# Patient Record
Sex: Female | Born: 1970
Health system: Southern US, Community
[De-identification: ages and names within clinical notes are randomized; demographics above are authoritative.]

## PROBLEM LIST (undated history)

## (undated) DIAGNOSIS — R011 Cardiac murmur, unspecified: Secondary | ICD-10-CM

## (undated) DIAGNOSIS — I10 Essential (primary) hypertension: Secondary | ICD-10-CM

## (undated) DIAGNOSIS — D649 Anemia, unspecified: Secondary | ICD-10-CM

## (undated) DIAGNOSIS — Z1239 Encounter for other screening for malignant neoplasm of breast: Secondary | ICD-10-CM

## (undated) HISTORY — DX: Anemia, unspecified: D64.9

## (undated) HISTORY — PX: BREAST EXCISIONAL BIOPSY: SUR124

## (undated) HISTORY — DX: Encounter for other screening for malignant neoplasm of breast: Z12.39

## (undated) HISTORY — PX: DILATION AND CURETTAGE OF UTERUS: SHX78

## (undated) HISTORY — PX: ABDOMINAL HYSTERECTOMY: SHX81

---

## 2002-12-08 HISTORY — PX: BREAST SURGERY: SHX581

## 2003-11-17 HISTORY — PX: OTHER SURGICAL HISTORY: SHX169

## 2005-10-22 ENCOUNTER — Ambulatory Visit: Payer: Self-pay | Admitting: General Surgery

## 2006-06-16 ENCOUNTER — Ambulatory Visit: Payer: Self-pay | Admitting: Internal Medicine

## 2006-07-17 ENCOUNTER — Ambulatory Visit: Payer: Self-pay | Admitting: Internal Medicine

## 2006-08-16 ENCOUNTER — Ambulatory Visit: Payer: Self-pay | Admitting: Internal Medicine

## 2006-09-16 ENCOUNTER — Ambulatory Visit: Payer: Self-pay | Admitting: Internal Medicine

## 2006-10-21 ENCOUNTER — Ambulatory Visit: Payer: Self-pay | Admitting: Internal Medicine

## 2006-10-25 ENCOUNTER — Ambulatory Visit: Payer: Self-pay | Admitting: General Surgery

## 2006-11-16 ENCOUNTER — Ambulatory Visit: Payer: Self-pay | Admitting: Internal Medicine

## 2006-12-17 ENCOUNTER — Ambulatory Visit: Payer: Self-pay | Admitting: Internal Medicine

## 2007-03-08 ENCOUNTER — Ambulatory Visit: Payer: Self-pay | Admitting: Internal Medicine

## 2007-03-17 ENCOUNTER — Ambulatory Visit: Payer: Self-pay | Admitting: Internal Medicine

## 2007-06-08 ENCOUNTER — Ambulatory Visit: Payer: Self-pay | Admitting: Internal Medicine

## 2007-06-09 ENCOUNTER — Ambulatory Visit: Payer: Self-pay | Admitting: Internal Medicine

## 2007-06-17 ENCOUNTER — Ambulatory Visit: Payer: Self-pay | Admitting: Internal Medicine

## 2007-07-18 ENCOUNTER — Ambulatory Visit: Payer: Self-pay | Admitting: Internal Medicine

## 2007-08-04 ENCOUNTER — Ambulatory Visit: Payer: Self-pay | Admitting: Internal Medicine

## 2007-08-17 ENCOUNTER — Ambulatory Visit: Payer: Self-pay | Admitting: Internal Medicine

## 2007-11-07 ENCOUNTER — Ambulatory Visit: Payer: Self-pay | Admitting: Internal Medicine

## 2007-11-17 ENCOUNTER — Ambulatory Visit: Payer: Self-pay | Admitting: Internal Medicine

## 2007-11-24 ENCOUNTER — Ambulatory Visit: Payer: Self-pay | Admitting: General Surgery

## 2008-02-21 DIAGNOSIS — J40 Bronchitis, not specified as acute or chronic: Secondary | ICD-10-CM | POA: Insufficient documentation

## 2008-11-26 ENCOUNTER — Ambulatory Visit: Payer: Self-pay | Admitting: General Surgery

## 2009-01-28 ENCOUNTER — Ambulatory Visit: Payer: Self-pay | Admitting: Internal Medicine

## 2009-12-26 ENCOUNTER — Ambulatory Visit: Payer: Self-pay | Admitting: General Surgery

## 2010-12-30 ENCOUNTER — Ambulatory Visit: Payer: Self-pay | Admitting: General Surgery

## 2011-11-17 DIAGNOSIS — Z1239 Encounter for other screening for malignant neoplasm of breast: Secondary | ICD-10-CM

## 2011-11-17 HISTORY — DX: Encounter for other screening for malignant neoplasm of breast: Z12.39

## 2012-01-13 ENCOUNTER — Ambulatory Visit: Payer: Self-pay | Admitting: Obstetrics & Gynecology

## 2012-12-24 ENCOUNTER — Encounter: Payer: Self-pay | Admitting: *Deleted

## 2014-01-10 ENCOUNTER — Ambulatory Visit: Payer: Self-pay | Admitting: General Surgery

## 2014-01-18 ENCOUNTER — Encounter: Payer: Self-pay | Admitting: General Surgery

## 2014-01-18 ENCOUNTER — Ambulatory Visit (INDEPENDENT_AMBULATORY_CARE_PROVIDER_SITE_OTHER): Payer: BC Managed Care – PPO | Admitting: General Surgery

## 2014-01-18 VITALS — BP 138/74 | HR 72 | Resp 12 | Ht 64.0 in | Wt 132.0 lb

## 2014-01-18 DIAGNOSIS — N6019 Diffuse cystic mastopathy of unspecified breast: Secondary | ICD-10-CM

## 2014-01-18 DIAGNOSIS — Z803 Family history of malignant neoplasm of breast: Secondary | ICD-10-CM

## 2014-01-18 NOTE — Patient Instructions (Signed)
Follow up in one year with bilateral screening mammogram and office visit. Continue self breast exams. Call office for any new breast issues or concerns.

## 2014-01-18 NOTE — Progress Notes (Signed)
Patient ID: Priscilla Fernandez, female   DOB: Jun 18, 1971, 43 y.o.   MRN: [id number]  Chief Complaint  Patient presents with  . Follow-up    mammogram    HPI Priscilla Fernandez is a 43 y.o. female.  who presents for follow up mammogram breast evaluation. The most recent mammogram was done on 10-09-14.  Patient does perform regular self breast checks and gets regular mammograms done.  No new breast issues. She has had multiple prior breast biopsies-all benign.  HPI  Past Medical History  Diagnosis Date  . Anemia   . Breast screening, unspecified 2013    Past Surgical History  Procedure Laterality Date  . Breast tumor removal   2005  . Dilation and curettage of uterus    . Breast surgery Right 12-08-02    fibrocystic    Family History  Problem Relation Age of Onset  . Breast cancer Maternal Grandmother     Social History History  Substance Use Topics  . Smoking status: Never Smoker   . Smokeless tobacco: Never Used  . Alcohol Use: Yes     Comment: rare    No Known Allergies  No current outpatient prescriptions on file.   No current facility-administered medications for this visit.    Review of Systems Review of Systems  Constitutional: Negative.   Respiratory: Negative.   Cardiovascular: Negative.     Blood pressure 138/74, pulse 72, resp. rate 12, height 5\' 4"  (1.626 m), weight 132 lb (59.875 kg), last menstrual period 01/02/2014.  Physical Exam Physical Exam  Constitutional: She is oriented to person, place, and time. She appears well-developed and well-nourished.  Eyes: No scleral icterus.  Neck: Neck supple.  Cardiovascular: Normal rate and regular rhythm.   Murmur heard. Stable heart murmur  Pulmonary/Chest: Effort normal and breath sounds normal. Right breast exhibits no inverted nipple, no mass, no nipple discharge, no skin change and no tenderness. Left breast exhibits no inverted nipple, no mass, no nipple discharge, no skin change and no tenderness.   Lymphadenopathy:    She has no cervical adenopathy.    She has no axillary adenopathy.  Neurological: She is alert and oriented to person, place, and time.  Skin: Skin is warm and dry.    Data Reviewed Mammogram reviewed and stable.  Assessment    Stable physical exam.    Plan    Follow up in one year with bilateral screening mammogram and office visit.       Taneal Sonntag G 01/18/2014, 2:59 PM

## 2014-01-29 ENCOUNTER — Ambulatory Visit: Payer: Self-pay | Admitting: General Surgery

## 2014-04-12 ENCOUNTER — Ambulatory Visit: Payer: Self-pay | Admitting: Emergency Medicine

## 2014-07-26 ENCOUNTER — Ambulatory Visit: Payer: Self-pay | Admitting: Physician Assistant

## 2014-09-17 ENCOUNTER — Encounter: Payer: Self-pay | Admitting: General Surgery

## 2014-10-29 ENCOUNTER — Encounter: Payer: Self-pay | Admitting: General Surgery

## 2014-10-29 ENCOUNTER — Ambulatory Visit (INDEPENDENT_AMBULATORY_CARE_PROVIDER_SITE_OTHER): Payer: BC Managed Care – PPO | Admitting: General Surgery

## 2014-10-29 VITALS — BP 120/80 | HR 76 | Resp 12 | Ht 64.0 in | Wt 133.0 lb

## 2014-10-29 DIAGNOSIS — N6019 Diffuse cystic mastopathy of unspecified breast: Secondary | ICD-10-CM

## 2014-10-29 NOTE — Progress Notes (Signed)
Patient ID: Priscilla Fernandez, female   DOB: Aug 24, 1971, 43 y.o.   MRN: [id number]  Chief Complaint  Patient presents with  . Follow-up    mammogram    HPI Priscilla Fernandez is a 43 y.o. female who presents for a breast evaluation. The most recent mammogram was done on 10/16/14. Patient does perform regular self breast checks and gets regular mammograms done. No complaints at this time.   HPI  Past Medical History  Diagnosis Date  . Anemia   . Breast screening, unspecified 2013    Past Surgical History  Procedure Laterality Date  . Breast tumor removal   2005  . Dilation and curettage of uterus    . Breast surgery Right 12-08-02    fibrocystic    Family History  Problem Relation Age of Onset  . Breast cancer Maternal Grandmother     Social History History  Substance Use Topics  . Smoking status: Never Smoker   . Smokeless tobacco: Never Used  . Alcohol Use: Yes     Comment: rare    No Known Allergies  No current outpatient prescriptions on file.   No current facility-administered medications for this visit.    Review of Systems Review of Systems  Constitutional: Negative.   Respiratory: Negative.   Cardiovascular: Negative.     Blood pressure 120/80, pulse 76, resp. rate 12, height 5\' 4"  (1.626 m), weight 133 lb (60.328 kg), last menstrual period 10/13/2014.  Physical Exam Physical Exam  Constitutional: She is oriented to person, place, and time. She appears well-developed and well-nourished.  Eyes: Conjunctivae are normal. No scleral icterus.  Neck: Neck supple. No thyromegaly present.  Cardiovascular: Normal rate and regular rhythm.   Murmur heard.  Systolic murmur is present with a grade of 3/6  Murmur unchanged from before  Pulmonary/Chest: Effort normal and breath sounds normal. Right breast exhibits no inverted nipple, no mass, no nipple discharge, no skin change and no tenderness. Left breast exhibits no inverted nipple, no mass, no nipple discharge, no  skin change and no tenderness.  Lymphadenopathy:    She has no cervical adenopathy.    She has no axillary adenopathy.  Neurological: She is alert and oriented to person, place, and time.  Skin: Skin is warm and dry.    Data Reviewed  Mammogram reviewed- cat 2. Very dense breasts as noted before.   Assessment    Stable Exam. History of FCD, multiple prior biopsies    Plan    Patient to return in 1 year with bilateral screening mammogram.        Priscilla Fernandez 10/29/2014, 4:21 PM

## 2014-10-29 NOTE — Patient Instructions (Signed)
Patient to return in 1 year with bilateral screening mammogram. Continue self breast exams. Call office for any new breast issues or concerns.  

## 2015-10-30 LAB — HM MAMMOGRAPHY

## 2015-11-04 ENCOUNTER — Ambulatory Visit: Payer: Self-pay | Admitting: General Surgery

## 2015-11-06 ENCOUNTER — Ambulatory Visit (INDEPENDENT_AMBULATORY_CARE_PROVIDER_SITE_OTHER): Payer: BLUE CROSS/BLUE SHIELD | Admitting: General Surgery

## 2015-11-06 ENCOUNTER — Encounter: Payer: Self-pay | Admitting: General Surgery

## 2015-11-06 VITALS — BP 144/72 | HR 72 | Resp 12 | Ht 64.0 in | Wt 134.0 lb

## 2015-11-06 DIAGNOSIS — D649 Anemia, unspecified: Secondary | ICD-10-CM

## 2015-11-06 DIAGNOSIS — N6019 Diffuse cystic mastopathy of unspecified breast: Secondary | ICD-10-CM | POA: Diagnosis not present

## 2015-11-06 LAB — HM PAP SMEAR: HM Pap smear: NEGATIVE

## 2015-11-06 NOTE — Progress Notes (Signed)
Patient ID: Priscilla Fernandez, female   DOB: 1971/05/02, 44 y.o.   MRN: [id number]  Chief Complaint  Patient presents with  . Follow-up    mammogram    HPI Priscilla Fernandez is a 44 y.o. female who presents for a breast evaluation. The most recent mammogram was done on 10/28/15.  Patient does perform regular self breast checks and gets regular mammograms done.   I have reviewed the history of present illness with the patient.  HPI  Past Medical History  Diagnosis Date  . Anemia   . Breast screening, unspecified 2013    Past Surgical History  Procedure Laterality Date  . Breast tumor removal   2005  . Dilation and curettage of uterus    . Breast surgery Right 12-08-02    fibrocystic    Family History  Problem Relation Age of Onset  . Breast cancer Maternal Grandmother     Social History Social History  Substance Use Topics  . Smoking status: Never Smoker   . Smokeless tobacco: Never Used  . Alcohol Use: Yes     Comment: rare    No Known Allergies  No current outpatient prescriptions on file.   No current facility-administered medications for this visit.    Review of Systems Review of Systems  Constitutional: Negative.   Respiratory: Negative.   Cardiovascular: Negative.     Blood pressure 144/72, pulse 72, resp. rate 12, height '5\' 4"'$  (1.626 m), weight 134 lb (60.782 kg), last menstrual period 10/18/2015.  Physical Exam Physical Exam  Constitutional: She is oriented to person, place, and time. She appears well-developed and well-nourished.  Eyes: No scleral icterus.  Conjunctivae-pale  Neck: Neck supple.  Cardiovascular: Normal rate and regular rhythm.   Murmur heard.  Systolic murmur is present with a grade of 2/6  Pulmonary/Chest: Effort normal and breath sounds normal. Right breast exhibits no inverted nipple, no mass, no nipple discharge, no skin change and no tenderness. Left breast exhibits no inverted nipple, no mass, no nipple discharge, no  skin change and no tenderness.  Abdominal: Soft. Bowel sounds are normal. There is no tenderness.  Lymphadenopathy:    She has no cervical adenopathy.    She has no axillary adenopathy.  Neurological: She is alert and oriented to person, place, and time.  Skin: Skin is warm and dry.    Data Reviewed Mammogram reviewed  Assessment    Stable exam, History of FCD, multiple prior biopsies. Anemia-previously followed by hematology-has not had recent follow up there    Plan   CBC,Met C today. Referral to hematology to reestablish follow up The patient has been asked to return to the office in one year with a bilateral screening mammogram.    PCP:  No Pcp Per Patient This information has been scribed by Gaspar Cola CMA.    SANKAR,SEEPLAPUTHUR G 11/06/2015, 4:48 PM

## 2015-11-06 NOTE — Patient Instructions (Signed)
The patient has been asked to return to the office in one year with a bilateral screening mammogram. 

## 2015-11-07 LAB — CBC WITH DIFFERENTIAL/PLATELET
BASOS: 1 %
Basophils Absolute: 0 10*3/uL (ref 0.0–0.2)
EOS (ABSOLUTE): 0.2 10*3/uL (ref 0.0–0.4)
Eos: 4 %
Hematocrit: 24.2 % — ABNORMAL LOW (ref 34.0–46.6)
Hemoglobin: 6.2 g/dL — CL (ref 11.1–15.9)
Immature Grans (Abs): 0 10*3/uL (ref 0.0–0.1)
Immature Granulocytes: 0 %
Lymphocytes Absolute: 1.4 10*3/uL (ref 0.7–3.1)
Lymphs: 36 %
MCH: 14.4 pg — AB (ref 26.6–33.0)
MCHC: 25.6 g/dL — AB (ref 31.5–35.7)
MCV: 56 fL — AB (ref 79–97)
MONOS ABS: 0.6 10*3/uL (ref 0.1–0.9)
Monocytes: 15 %
NEUTROS ABS: 1.7 10*3/uL (ref 1.4–7.0)
NEUTROS PCT: 44 %
PLATELETS: 516 10*3/uL — AB (ref 150–379)
RBC: 4.32 x10E6/uL (ref 3.77–5.28)
RDW: 19.1 % — ABNORMAL HIGH (ref 12.3–15.4)
WBC: 3.8 10*3/uL (ref 3.4–10.8)

## 2015-11-07 LAB — BASIC METABOLIC PANEL
BUN / CREAT RATIO: 10 (ref 9–23)
BUN: 8 mg/dL (ref 6–24)
CHLORIDE: 106 mmol/L (ref 96–106)
CO2: 22 mmol/L (ref 18–29)
Calcium: 10.3 mg/dL — ABNORMAL HIGH (ref 8.7–10.2)
Creatinine, Ser: 0.77 mg/dL (ref 0.57–1.00)
GFR calc non Af Amer: 93 mL/min/{1.73_m2} (ref >59)
GFR, EST AFRICAN AMERICAN: 107 mL/min/{1.73_m2} (ref >59)
Glucose: 82 mg/dL (ref 65–99)
POTASSIUM: 4.9 mmol/L (ref 3.5–5.2)
SODIUM: 140 mmol/L (ref 134–144)

## 2015-11-08 ENCOUNTER — Other Ambulatory Visit: Payer: Self-pay | Admitting: *Deleted

## 2015-11-08 DIAGNOSIS — D649 Anemia, unspecified: Secondary | ICD-10-CM

## 2015-11-12 ENCOUNTER — Inpatient Hospital Stay: Payer: BLUE CROSS/BLUE SHIELD

## 2015-11-12 NOTE — Progress Notes (Signed)
Priscilla Fernandez spoke with Priscilla Fernandez at the [facility] and patient has been scheduled for an appointment this week (week of 11-11-15).

## 2015-11-13 ENCOUNTER — Other Ambulatory Visit: Payer: Self-pay | Admitting: Internal Medicine

## 2015-11-13 ENCOUNTER — Other Ambulatory Visit: Payer: Self-pay | Admitting: *Deleted

## 2015-11-13 ENCOUNTER — Telehealth: Payer: Self-pay | Admitting: *Deleted

## 2015-11-13 ENCOUNTER — Inpatient Hospital Stay: Payer: BLUE CROSS/BLUE SHIELD

## 2015-11-13 DIAGNOSIS — Z803 Family history of malignant neoplasm of breast: Secondary | ICD-10-CM | POA: Diagnosis not present

## 2015-11-13 DIAGNOSIS — D649 Anemia, unspecified: Secondary | ICD-10-CM

## 2015-11-13 DIAGNOSIS — D72819 Decreased white blood cell count, unspecified: Secondary | ICD-10-CM | POA: Diagnosis not present

## 2015-11-13 DIAGNOSIS — D500 Iron deficiency anemia secondary to blood loss (chronic): Secondary | ICD-10-CM

## 2015-11-13 DIAGNOSIS — N920 Excessive and frequent menstruation with regular cycle: Secondary | ICD-10-CM | POA: Insufficient documentation

## 2015-11-13 DIAGNOSIS — D509 Iron deficiency anemia, unspecified: Secondary | ICD-10-CM | POA: Diagnosis present

## 2015-11-13 LAB — CBC WITH DIFFERENTIAL/PLATELET
BASOS PCT: 5 %
BLASTS: 0 %
Band Neutrophils: 2 %
EOS PCT: 2 %
HCT: 22.5 % — ABNORMAL LOW (ref 35.0–47.0)
HEMOGLOBIN: 6.1 g/dL — AB (ref 12.0–16.0)
Lymphocytes Relative: 24 %
MCH: 14 pg — ABNORMAL LOW (ref 26.0–34.0)
MCHC: 27.1 g/dL — ABNORMAL LOW (ref 32.0–36.0)
MCV: 51.8 fL — ABNORMAL LOW (ref 80.0–100.0)
MONOS PCT: 13 %
Metamyelocytes Relative: 0 %
Myelocytes: 0 %
Neutrophils Relative %: 54 %
OTHER: 0 %
Platelets: 267 10*3/uL (ref 150–440)
Promyelocytes Absolute: 0 %
RBC: 4.35 MIL/uL (ref 3.80–5.20)
RDW: 21.1 % — ABNORMAL HIGH (ref 11.5–14.5)
Smear Review: ADEQUATE
WBC: 2.7 10*3/uL — ABNORMAL LOW (ref 3.6–11.0)
nRBC: 0 /100 WBC

## 2015-11-13 LAB — IRON AND TIBC
Iron: 28 ug/dL (ref 28–170)
Saturation Ratios: 6 % — ABNORMAL LOW (ref 10.4–31.8)
TIBC: 494 ug/dL — ABNORMAL HIGH (ref 250–450)
UIBC: 466 ug/dL

## 2015-11-13 LAB — PREPARE RBC (CROSSMATCH)

## 2015-11-13 LAB — FERRITIN: Ferritin: 1 ng/mL — ABNORMAL LOW (ref 11–307)

## 2015-11-13 LAB — SAMPLE TO BLOOD BANK

## 2015-11-13 LAB — ABO/RH: ABO/RH(D): O POS

## 2015-11-13 NOTE — Telephone Encounter (Signed)
Contacted pt to let her know that she will need blood transfusion tomorrow.  I have already released the orders and checked with blood bank and they know it is 2 units to go to [place].  I have emailed blood bank as well as courier service. I also called the cell phone we have for courier and left message also. Patient is to be there in [place] at 9 am.

## 2015-11-14 ENCOUNTER — Inpatient Hospital Stay: Payer: BLUE CROSS/BLUE SHIELD | Admitting: Internal Medicine

## 2015-11-14 ENCOUNTER — Inpatient Hospital Stay: Payer: BLUE CROSS/BLUE SHIELD | Attending: Internal Medicine | Admitting: Internal Medicine

## 2015-11-14 ENCOUNTER — Encounter: Payer: Self-pay | Admitting: Internal Medicine

## 2015-11-14 VITALS — BP 148/65 | HR 65 | Temp 98.2°F

## 2015-11-14 VITALS — BP 141/67 | HR 65 | Temp 97.5°F | Resp 18 | Ht 63.0 in | Wt 132.3 lb

## 2015-11-14 DIAGNOSIS — N920 Excessive and frequent menstruation with regular cycle: Secondary | ICD-10-CM | POA: Diagnosis not present

## 2015-11-14 DIAGNOSIS — D509 Iron deficiency anemia, unspecified: Secondary | ICD-10-CM

## 2015-11-14 DIAGNOSIS — D500 Iron deficiency anemia secondary to blood loss (chronic): Secondary | ICD-10-CM

## 2015-11-14 DIAGNOSIS — Z803 Family history of malignant neoplasm of breast: Secondary | ICD-10-CM

## 2015-11-14 DIAGNOSIS — D72819 Decreased white blood cell count, unspecified: Secondary | ICD-10-CM

## 2015-11-14 LAB — PREPARE RBC (CROSSMATCH)

## 2015-11-14 LAB — HEMOGLOBINOPATHY EVALUATION
HGB A2 QUANT: 1.4 % (ref 0.7–3.1)
HGB A: 98.6 % — AB (ref 94.0–98.0)
Hgb C: 0 %
Hgb F Quant: 0 % (ref 0.0–2.0)
Hgb S Quant: 0 %

## 2015-11-14 MED ORDER — FERROUS SULFATE 325 (65 FE) MG PO TBEC: 325.0000 mg | DELAYED_RELEASE_TABLET | Freq: Three times a day (TID) | ORAL | Status: DC

## 2015-11-14 MED ORDER — SODIUM CHLORIDE 0.9 % IV SOLN
250.0000 mL | Freq: Once | INTRAVENOUS | Status: AC
  Administered 2015-11-14: 250 mL via INTRAVENOUS
  Filled 2015-11-14: qty 250

## 2015-11-14 MED ORDER — SODIUM CHLORIDE 0.9 % IV SOLN
INTRAVENOUS | Status: AC
  Filled 2015-11-14: qty 1000

## 2015-11-14 MED ORDER — ACETAMINOPHEN 325 MG PO TABS
650.0000 mg | ORAL_TABLET | Freq: Once | ORAL | Status: AC
  Administered 2015-11-14: 650 mg via ORAL
  Filled 2015-11-14: qty 2

## 2015-11-14 NOTE — Progress Notes (Signed)
Pt new eval. For anemia. She has had IDA in the  past but it was a long time ago. The last time she was seen in cancer center was 2008.  She does not have blood in urine or stool. The does still have her menstrual cycles and had one 12/2 and then light one 12/22. She has never had EGD/Colonoscopy

## 2015-11-14 NOTE — Addendum Note (Signed)
Addended by: Luella Cook on: 11/14/2015 12:58 PM   Modules accepted: Orders

## 2015-11-14 NOTE — Progress Notes (Signed)
[place] @ [facility] [phone]  Fax:([phone]) [place]: 1970/12/08  MR#: [id number]  [id number]  Patient Care Team: No Pcp Per Patient as PCP - General (General Practice) Seeplaputhur Robinette Haines, MD (General Surgery) Gae Dry, MD as Referring Physician (Obstetrics and Gynecology)  CHIEF COMPLAINT:  Chief Complaint  Patient presents with  . IDA     No history exists.    No flowsheet data found.  HISTORY OF PRESENT ILLNESS:   Ms. Hawken is a 44 year old African-American female who is referred to our clinic for persistent iron deficiency anemia. She previously was seen in our clinic in 2007-2010, and was treated with intravenous Feraheme for iron deficiency. She also was on oral iron supplementation for short period of time, and was able to tolerate that well. Then she was lost to follow-up. She claims that she does not have any gastrointestinal bleeding, and the color of her stool is normal. She has menstrual bleeding last for 4 days every months. She has to use 5-7 heavy duty pads a day. She denies dizziness, chest pain, shortness of breath, fainting, but admits to feeling more sluggish than normal.  REVIEW OF SYSTEMS:   Review of Systems  All other systems reviewed and are negative.    PAST MEDICAL HISTORY: Past Medical History  Diagnosis Date  . Anemia   . Breast screening, unspecified 2013    PAST SURGICAL HISTORY: Past Surgical History  Procedure Laterality Date  . Breast tumor removal   2005  . Dilation and curettage of uterus    . Breast surgery Right 12-08-02    fibrocystic    FAMILY HISTORY Family History  Problem Relation Age of Onset  . Breast cancer Maternal Grandmother     ADVANCED DIRECTIVES:  No flowsheet data found.  HEALTH MAINTENANCE: Social History  Substance Use Topics  . Smoking status: Never Smoker   . Smokeless tobacco: Never Used  . Alcohol Use: Yes     Comment: rare     No Known  Allergies  No current outpatient prescriptions on file.   No current facility-administered medications for this visit.   Facility-Administered Medications Ordered in Other Visits  Medication Dose Route Frequency Provider Last Rate Last Dose  . acetaminophen (TYLENOL) tablet 650 mg  650 mg Oral Once Valli Randol, MD        OBJECTIVE:  Filed Vitals:   11/14/15 0906  BP: 141/67  Pulse: 65  Temp: 97.5 F (36.4 C)  Resp: 18     Body mass index is 23.44 kg/(m^2).    ECOG FS:0 - Asymptomatic  Physical Exam  Constitutional: She is oriented to person, place, and time.  Pale African-American female in no significant distress  HENT:  Head: Normocephalic and atraumatic.  Right Ear: External ear normal.  Left Ear: External ear normal.  Mouth/Throat: Oropharynx is clear and moist.  Eyes: Conjunctivae are normal. Pupils are equal, round, and reactive to light. Right eye exhibits no discharge. Left eye exhibits no discharge. No scleral icterus.  Neck: Normal range of motion. Neck supple. No JVD present. No tracheal deviation present. No thyromegaly present.  Cardiovascular: Normal rate, regular rhythm, normal heart sounds and intact distal pulses.  Exam reveals no gallop and no friction rub.   No murmur heard. Pulmonary/Chest: Effort normal and breath sounds normal. No stridor. No respiratory distress. She has no wheezes. She has no rales. She exhibits no tenderness.  Abdominal: Soft. Bowel sounds are normal. She exhibits  no distension and no mass. There is no tenderness. There is no rebound and no guarding.  Genitourinary:  Postponed  Musculoskeletal: Normal range of motion. She exhibits no edema or tenderness.  Lymphadenopathy:    She has no cervical adenopathy.  Neurological: She is alert and oriented to person, place, and time. She has normal reflexes. No cranial nerve deficit. She exhibits normal muscle tone. Gait normal. Coordination normal. GCS score is 15.  Skin: Skin is warm.  No rash noted. She is not diaphoretic. No erythema. No pallor.  Psychiatric: Mood, memory, affect and judgment normal.  Nursing note and vitals reviewed.    LAB RESULTS:  CBC Latest Ref Rng 11/13/2015 11/06/2015  WBC 3.6 - 11.0 K/uL 2.7(L) 3.8  Hemoglobin 12.0 - 16.0 g/dL 6.1(L) -  Hematocrit 35.0 - 47.0 % 22.5(L) 24.2(L)  Platelets 150 - 440 K/uL 267 -    Orders Only on 11/13/2015  Component Date Value Ref Range Status  . ABO/RH(D) 11/13/2015 O POS   Final  . Antibody Screen 11/13/2015 NEG   Final  . Sample Expiration 11/13/2015 11/16/2015   Final  . Unit Number 11/13/2015 [id number]   Final  . Blood Component Type 11/13/2015 RBC LR [id number]   Final  . Unit division 11/13/2015 00   Final  . Status of Unit 11/13/2015 ISSUED   Final  . Transfusion Status 11/13/2015 OK TO TRANSFUSE   Final  . Crossmatch Result 11/13/2015 Compatible   Final  . Unit Number 11/13/2015 [id number]   Final  . Blood Component Type 11/13/2015 RBC LR PHER1   Final  . Unit division 11/13/2015 00   Final  . Status of Unit 11/13/2015 ALLOCATED   Final  . Transfusion Status 11/13/2015 OK TO TRANSFUSE   Final  . Crossmatch Result 11/13/2015 Compatible   Final  . Order Confirmation 11/13/2015 ORDER PROCESSED BY BLOOD BANK   Final  Appointment on 11/13/2015  Component Date Value Ref Range Status  . WBC 11/13/2015 2.7* 3.6 - 11.0 K/uL Final  . RBC 11/13/2015 4.35  3.80 - 5.20 MIL/uL Final  . Hemoglobin 11/13/2015 6.1* 12.0 - 16.0 g/dL Final   RESULT REPEATED AND VERIFIED  . HCT 11/13/2015 22.5* 35.0 - 47.0 % Final   RESULT REPEATED AND VERIFIED  . MCV 11/13/2015 51.8* 80.0 - 100.0 fL Final  . MCH 11/13/2015 14.0* 26.0 - 34.0 pg Final  . MCHC 11/13/2015 27.1* 32.0 - 36.0 g/dL Final  . RDW 11/13/2015 21.1* 11.5 - 14.5 % Final  . Platelets 11/13/2015 267  150 - 440 K/uL Final  . Neutrophils Relative % 11/13/2015 54   Final  . Band Neutrophils 11/13/2015 2   Final  . Lymphocytes Relative 11/13/2015 24    Final  . Monocytes Relative 11/13/2015 13   Final  . Eosinophils Relative 11/13/2015 2   Final  . Basophils Relative 11/13/2015 5   Final  . RBC Morphology 11/13/2015 TEARDROP CELLS   Final   Comment: MARKED POIKILOCYTOSIS ELLIPTOCYTES TARGET CELLS HYPOCHROMIA MICROCYTES   . Smear Review 11/13/2015 PLATELETS APPEAR ADEQUATE   Final  . Other 11/13/2015 0   Final  . nRBC 11/13/2015 0  0 /100 WBC Final  . Metamyelocytes Relative 11/13/2015 0   Final  . Myelocytes 11/13/2015 0   Final  . Promyelocytes Absolute 11/13/2015 0   Final  . Blasts 11/13/2015 0   Final  . Ferritin 11/13/2015 1* 11 - 307 ng/mL Final  . Blood Bank Specimen 11/13/2015 SAMPLE AVAILABLE FOR TESTING  Final  . Sample Expiration 11/13/2015 11/16/2015   Final  . Iron 11/13/2015 28  28 - 170 ug/dL Final  . TIBC 11/13/2015 494* 250 - 450 ug/dL Final  . Saturation Ratios 11/13/2015 6* 10.4 - 31.8 % Final  . UIBC 11/13/2015 466   Final  . ABO/RH(D) 11/13/2015 O POS   Final    STUDIES: No results found.  ASSESSMENT and MEDICAL DECISION MAKING:   Anemia-clearly there is severe case of iron deficiency anemia, likely secondary to menstrual bleeding. Patient has had history of iron deficiency for the past 8 years, but has not been followed regularly and has not received treatment on a consistent basis. Taking into account severe degree of anemia we will transfuse 2 units of red blood cells, and will arrange intravenous Feraheme to be given next week and a week after next. She will start with ferrous sulfate over-the-counter supplementation 3 times a day and will continue until the next appointment. Patient will return to our clinic 6 weeks from today with labs done the day before that appointment. Extremely low MCV may suggest the presence of hemoglobinopathy. Previously hemoglobin electrophoresis was performed and was reported negative. There is still possibility of that alpha thalassemia trait is present, however, currently it  is of no consequence for the patient. We will request alpha thalassemia testing for her next visit.  Leukopenia-appears to be new. We will monitor her CBC during the next visit, and if persists, will test for vitamin B12, ANA, copper and zinc.  Patient expressed understanding and was in agreement with this plan. She also understands that She can call clinic at any time with any questions, concerns, or complaints.    No matching staging information was found for the patient.  Roxana Hires, MD   11/14/2015 9:54 AM

## 2015-11-16 LAB — TYPE AND SCREEN
ABO/RH(D): O POS
ANTIBODY SCREEN: NEGATIVE
UNIT DIVISION: 0
UNIT DIVISION: 0
UNIT DIVISION: 0
UNIT DIVISION: 0

## 2015-11-21 ENCOUNTER — Other Ambulatory Visit: Payer: Self-pay | Admitting: *Deleted

## 2015-11-21 ENCOUNTER — Inpatient Hospital Stay: Payer: BLUE CROSS/BLUE SHIELD | Attending: Internal Medicine

## 2015-11-21 VITALS — BP 143/71 | HR 54 | Temp 98.1°F | Resp 18

## 2015-11-21 DIAGNOSIS — D72819 Decreased white blood cell count, unspecified: Secondary | ICD-10-CM | POA: Diagnosis not present

## 2015-11-21 DIAGNOSIS — N920 Excessive and frequent menstruation with regular cycle: Secondary | ICD-10-CM | POA: Insufficient documentation

## 2015-11-21 DIAGNOSIS — D509 Iron deficiency anemia, unspecified: Secondary | ICD-10-CM | POA: Insufficient documentation

## 2015-11-21 DIAGNOSIS — D500 Iron deficiency anemia secondary to blood loss (chronic): Secondary | ICD-10-CM

## 2015-11-21 MED ORDER — SODIUM CHLORIDE 0.9 % IV SOLN
510.0000 mg | Freq: Once | INTRAVENOUS | Status: AC
  Administered 2015-11-21: 510 mg via INTRAVENOUS
  Filled 2015-11-21: qty 17

## 2015-11-21 MED ORDER — SODIUM CHLORIDE 0.9 % IV SOLN
Freq: Once | INTRAVENOUS | Status: AC
  Administered 2015-11-21: 10:00:00 via INTRAVENOUS
  Filled 2015-11-21: qty 1000

## 2015-11-28 ENCOUNTER — Inpatient Hospital Stay: Payer: BLUE CROSS/BLUE SHIELD

## 2015-11-28 VITALS — BP 140/76 | HR 60 | Temp 98.9°F | Resp 18

## 2015-11-28 DIAGNOSIS — D500 Iron deficiency anemia secondary to blood loss (chronic): Secondary | ICD-10-CM

## 2015-11-28 DIAGNOSIS — D509 Iron deficiency anemia, unspecified: Secondary | ICD-10-CM | POA: Diagnosis not present

## 2015-11-28 MED ORDER — SODIUM CHLORIDE 0.9 % IV SOLN
Freq: Once | INTRAVENOUS | Status: AC
  Administered 2015-11-28: 11:00:00 via INTRAVENOUS
  Filled 2015-11-28: qty 1000

## 2015-11-28 MED ORDER — SODIUM CHLORIDE 0.9 % IV SOLN
510.0000 mg | Freq: Once | INTRAVENOUS | Status: AC
  Administered 2015-11-28: 510 mg via INTRAVENOUS
  Filled 2015-11-28: qty 17

## 2015-12-25 ENCOUNTER — Inpatient Hospital Stay: Payer: BLUE CROSS/BLUE SHIELD | Attending: Internal Medicine

## 2015-12-25 DIAGNOSIS — D509 Iron deficiency anemia, unspecified: Secondary | ICD-10-CM | POA: Insufficient documentation

## 2015-12-25 DIAGNOSIS — N920 Excessive and frequent menstruation with regular cycle: Secondary | ICD-10-CM | POA: Diagnosis not present

## 2015-12-25 DIAGNOSIS — Z79899 Other long term (current) drug therapy: Secondary | ICD-10-CM | POA: Diagnosis not present

## 2015-12-25 DIAGNOSIS — Z803 Family history of malignant neoplasm of breast: Secondary | ICD-10-CM | POA: Insufficient documentation

## 2015-12-25 LAB — SAMPLE TO BLOOD BANK

## 2015-12-25 LAB — CBC WITH DIFFERENTIAL/PLATELET
BASOS ABS: 0 10*3/uL (ref 0–0.1)
Eosinophils Absolute: 0.3 10*3/uL (ref 0–0.7)
Eosinophils Relative: 6 %
HEMATOCRIT: 43.2 % (ref 35.0–47.0)
HEMOGLOBIN: 13.6 g/dL (ref 12.0–16.0)
Lymphocytes Relative: 17 %
Lymphs Abs: 0.9 10*3/uL — ABNORMAL LOW (ref 1.0–3.6)
MCH: 24.2 pg — ABNORMAL LOW (ref 26.0–34.0)
MCHC: 31.5 g/dL — ABNORMAL LOW (ref 32.0–36.0)
MCV: 76.9 fL — ABNORMAL LOW (ref 80.0–100.0)
Monocytes Absolute: 0.5 10*3/uL (ref 0.2–0.9)
Monocytes Relative: 10 %
NEUTROS ABS: 3.6 10*3/uL (ref 1.4–6.5)
Platelets: 140 10*3/uL — ABNORMAL LOW (ref 150–440)
RBC: 5.62 MIL/uL — ABNORMAL HIGH (ref 3.80–5.20)
RDW: 30.2 % — ABNORMAL HIGH (ref 11.5–14.5)
WBC: 5.4 10*3/uL (ref 3.6–11.0)

## 2015-12-25 LAB — FERRITIN: Ferritin: 83 ng/mL (ref 11–307)

## 2015-12-26 ENCOUNTER — Ambulatory Visit: Payer: BLUE CROSS/BLUE SHIELD

## 2016-01-02 ENCOUNTER — Inpatient Hospital Stay: Payer: BLUE CROSS/BLUE SHIELD

## 2016-01-02 LAB — MISC LABCORP TEST (SEND OUT)
LABCORP TEST NAME: 51172
Labcorp test code: 51172

## 2016-01-09 ENCOUNTER — Inpatient Hospital Stay: Payer: BLUE CROSS/BLUE SHIELD

## 2016-01-09 ENCOUNTER — Inpatient Hospital Stay (HOSPITAL_BASED_OUTPATIENT_CLINIC_OR_DEPARTMENT_OTHER): Payer: BLUE CROSS/BLUE SHIELD | Admitting: Internal Medicine

## 2016-01-09 VITALS — BP 146/85 | HR 60 | Temp 98.0°F | Resp 18 | Ht 63.0 in | Wt 140.0 lb

## 2016-01-09 DIAGNOSIS — Z79899 Other long term (current) drug therapy: Secondary | ICD-10-CM | POA: Diagnosis not present

## 2016-01-09 DIAGNOSIS — Z803 Family history of malignant neoplasm of breast: Secondary | ICD-10-CM

## 2016-01-09 DIAGNOSIS — D500 Iron deficiency anemia secondary to blood loss (chronic): Secondary | ICD-10-CM

## 2016-01-09 DIAGNOSIS — N920 Excessive and frequent menstruation with regular cycle: Secondary | ICD-10-CM

## 2016-01-09 DIAGNOSIS — D509 Iron deficiency anemia, unspecified: Secondary | ICD-10-CM | POA: Diagnosis not present

## 2016-01-09 LAB — CBC WITH DIFFERENTIAL/PLATELET
BASOS ABS: 0.1 10*3/uL (ref 0–0.1)
Basophils Relative: 1 %
EOS PCT: 5 %
Eosinophils Absolute: 0.2 10*3/uL (ref 0–0.7)
HEMATOCRIT: 35.8 % (ref 35.0–47.0)
Hemoglobin: 11.4 g/dL — ABNORMAL LOW (ref 12.0–16.0)
LYMPHS ABS: 0.6 10*3/uL — AB (ref 1.0–3.6)
LYMPHS PCT: 12 %
MCH: 25.6 pg — ABNORMAL LOW (ref 26.0–34.0)
MCHC: 31.9 g/dL — AB (ref 32.0–36.0)
MCV: 80.3 fL (ref 80.0–100.0)
MONO ABS: 0.4 10*3/uL (ref 0.2–0.9)
Monocytes Relative: 8 %
Neutro Abs: 3.5 10*3/uL (ref 1.4–6.5)
Neutrophils Relative %: 74 %
Platelets: 250 10*3/uL (ref 150–440)
RBC: 4.45 MIL/uL (ref 3.80–5.20)
RDW: 18.3 % — AB (ref 11.5–14.5)
WBC: 4.8 10*3/uL (ref 3.6–11.0)

## 2016-01-09 LAB — FERRITIN: Ferritin: 35 ng/mL (ref 11–307)

## 2016-01-09 NOTE — Progress Notes (Signed)
[place] @ [facility] Telephone:(336) [phone]  Fax:([phone]) Nordic: 1971-06-26  MR#: [id number]  [id number]  Patient Care Team: No Pcp Per Patient as PCP - General (General Practice) Seeplaputhur Robinette Haines, MD (General Surgery) Gae Dry, MD as Referring Physician (Obstetrics and Gynecology)  CHIEF COMPLAINT:  Chief Complaint  Patient presents with  . IDA     No history exists.    Oncology Flowsheet 11/21/2015 11/28/2015  ferumoxytol (FERAHEME) IV 510 mg 510 mg    HISTORY OF PRESENT ILLNESS:   Priscilla Fernandez is a 45 year old African-American female who is referred to our clinic for persistent iron deficiency anemia. She previously was seen in our clinic in 2007-2010, and was treated with intravenous Feraheme for iron deficiency. She also was on oral iron supplementation for short period of time, and was able to tolerate that well. Then she was lost to follow-up. She claims that she does not have any gastrointestinal bleeding, and the color of her stool is normal. She has menstrual bleeding last for 4 days every months. She has to use 5-7 heavy duty pads a day.  Current status: [name] returns to our clinic for a follow-up visit. She has done well since her initial presentation. She received 2 units of red blood cells, followed by 2 infusions of Feraheme. She has been taking oral iron supplementation 3 times a day since initial presentation. She has no complaints at this time She denies dizziness, chest pain, shortness of breath, fainting, bleeding from any source, except as outlined above.  REVIEW OF SYSTEMS:   Review of Systems  All other systems reviewed and are negative.    PAST MEDICAL HISTORY: Past Medical History  Diagnosis Date  . Anemia   . Breast screening, unspecified 2013    PAST SURGICAL HISTORY: Past Surgical History  Procedure Laterality Date  . Breast tumor removal   2005  . Dilation and curettage of uterus    .  Breast surgery Right 12-08-02    fibrocystic    FAMILY HISTORY Family History  Problem Relation Age of Onset  . Breast cancer Maternal Grandmother     ADVANCED DIRECTIVES:  No flowsheet data found.  HEALTH MAINTENANCE: Social History  Substance Use Topics  . Smoking status: Never Smoker   . Smokeless tobacco: Never Used  . Alcohol Use: Yes     Comment: rare     No Known Allergies  Current Outpatient Prescriptions  Medication Sig Dispense Refill  . ferrous sulfate 325 (65 FE) MG EC tablet Take 1 tablet (325 mg total) by mouth 3 (three) times daily with meals. 90 tablet 3   No current facility-administered medications for this visit.   Facility-Administered Medications Ordered in Other Visits  Medication Dose Route Frequency Provider Last Rate Last Dose  . 0.9 %  sodium chloride infusion   Intravenous Continuous Santino Kinsella, MD        OBJECTIVE:  Filed Vitals:   01/09/16 0850  BP: 146/85  Pulse: 60  Temp: 98 F (36.7 C)  Resp: 18     Body mass index is 24.8 kg/(m^2).    ECOG FS:0 - Asymptomatic  Physical Exam  Constitutional: She is oriented to person, place, and time.  Pale African-American female in no significant distress  HENT:  Head: Normocephalic and atraumatic.  Right Ear: External ear normal.  Left Ear: External ear normal.  Mouth/Throat: Oropharynx is clear and moist.  Eyes: Conjunctivae are normal. Pupils are equal, round,  and reactive to light. Right eye exhibits no discharge. Left eye exhibits no discharge. No scleral icterus.  Neck: Normal range of motion. Neck supple. No JVD present. No tracheal deviation present. No thyromegaly present.  Cardiovascular: Normal rate, regular rhythm, normal heart sounds and intact distal pulses.  Exam reveals no gallop and no friction rub.   No murmur heard. Pulmonary/Chest: Effort normal and breath sounds normal. No stridor. No respiratory distress. She has no wheezes. She has no rales. She exhibits no  tenderness.  Abdominal: Soft. Bowel sounds are normal. She exhibits no distension and no mass. There is no tenderness. There is no rebound and no guarding.  Genitourinary:  Postponed  Musculoskeletal: Normal range of motion. She exhibits no edema or tenderness.  Lymphadenopathy:    She has no cervical adenopathy.  Neurological: She is alert and oriented to person, place, and time. She has normal reflexes. No cranial nerve deficit. She exhibits normal muscle tone. Gait normal. Coordination normal. GCS score is 15.  Skin: Skin is warm. No rash noted. She is not diaphoretic. No erythema. No pallor.  Psychiatric: Mood, memory, affect and judgment normal.  Nursing note and vitals reviewed.    LAB RESULTS:  CBC Latest Ref Rng 12/25/2015 11/13/2015  WBC 3.6 - 11.0 K/uL 5.4 2.7(L)  Hemoglobin 12.0 - 16.0 g/dL 13.6 6.1(L)  Hematocrit 35.0 - 47.0 % 43.2 22.5(L)  Platelets 150 - 440 K/uL 140(L) 267    No visits with results within 5 Day(s) from this visit. Latest known visit with results is:  Appointment on 12/25/2015  Component Date Value Ref Range Status  . WBC 12/25/2015 5.4  3.6 - 11.0 K/uL Final  . RBC 12/25/2015 5.62* 3.80 - 5.20 MIL/uL Final  . Hemoglobin 12/25/2015 13.6  12.0 - 16.0 g/dL Final  . HCT 12/25/2015 43.2  35.0 - 47.0 % Final  . MCV 12/25/2015 76.9* 80.0 - 100.0 fL Final  . MCH 12/25/2015 24.2* 26.0 - 34.0 pg Final  . MCHC 12/25/2015 31.5* 32.0 - 36.0 g/dL Final  . RDW 12/25/2015 30.2* 11.5 - 14.5 % Final  . Platelets 12/25/2015 140* 150 - 440 K/uL Final  . Neutrophils Relative % 12/25/2015 66%   Final  . Neutro Abs 12/25/2015 3.6  1.4 - 6.5 K/uL Final  . Lymphocytes Relative 12/25/2015 17%   Final  . Lymphs Abs 12/25/2015 0.9* 1.0 - 3.6 K/uL Final  . Monocytes Relative 12/25/2015 10%   Final  . Monocytes Absolute 12/25/2015 0.5  0.2 - 0.9 K/uL Final  . Eosinophils Relative 12/25/2015 6%   Final  . Eosinophils Absolute 12/25/2015 0.3  0 - 0.7 K/uL Final  . Basophils  Relative 12/25/2015 1%   Final  . Basophils Absolute 12/25/2015 0.0  0 - 0.1 K/uL Final  . Ferritin 12/25/2015 83  11 - 307 ng/mL Final  . Blood Bank Specimen 12/25/2015 SAMPLE AVAILABLE FOR TESTING   Final  . Sample Expiration 12/25/2015 12/28/2015   Final  . [facility] test code 12/25/2015 [id number]   Final  . [facility] test name 12/25/2015 [id number]   Final  . Misc [facility] result 12/25/2015 COMMENT   Final   Comment: (NOTE) Test Ordered: [id number] Alpha-Thalassemia Alpha-Thalassemia              Comment                   TG   Molecular analysis report has been mailed. Results for this test are for research purposes only by the assay's manufacturer.  The performance characteristics of this product have not been established.  Results should not be used as a diagnostic procedure without confirmation of the diagnosis by another medically established diagnostic product or procedure. PDF                            .                         TG   Performed At: [facility] [place], [place] [id number] Lindon Romp MD [id number] Performed At: [facility] [address] [place], [place] [id number] Nechama Guard MD [id number]     STUDIES: No results found.  ASSESSMENT and MEDICAL DECISION MAKING:   Anemia-clearly there is severe case of iron deficiency anemia, likely secondary to menstrual bleeding. Patient has had history of iron deficiency for the past 8 years, but has not been followed regularly and has not received treatment on a consistent basis. She received 2 units of red blood cells and 2 infusions of Feraheme, continuing oral supplementation 3 times a day with significant improvement of iron stores and hemoglobin. We suspected that she could have thalassemia, taking into account her very low MCV at presentation, however, hemoglobin electrophoresis was negative for beta thalassemia, and genetic testing was negative for alpha thalassemia. At this time MCV has  completely normalized. Taking into account drop in hemoglobin from 13.6-11.8 g/dL over the past 2 weeks, we will check ferritin levels today, and have the patient return to our clinic in 1 month's time to recheck CBC and iron stores. We will consider repeating Feraheme if ferritin were to drop.  Leukopenia-one time only. Resolved.  Patient expressed understanding and was in agreement with this plan. She also understands that She can call clinic at any time with any questions, concerns, or complaints.    No matching staging information was found for the patient.  Roxana Hires, MD   01/09/2016 9:28 AM

## 2016-01-09 NOTE — Progress Notes (Signed)
Pt feels great, tolerating iron pills without any constipation or stomach discomfort.  No fatigue. No blood in stool or urine.

## 2016-02-05 ENCOUNTER — Other Ambulatory Visit: Payer: Self-pay | Admitting: Internal Medicine

## 2016-02-05 ENCOUNTER — Inpatient Hospital Stay: Payer: BLUE CROSS/BLUE SHIELD | Attending: Internal Medicine

## 2016-02-05 ENCOUNTER — Other Ambulatory Visit: Payer: Self-pay | Admitting: *Deleted

## 2016-02-05 DIAGNOSIS — Z803 Family history of malignant neoplasm of breast: Secondary | ICD-10-CM | POA: Insufficient documentation

## 2016-02-05 DIAGNOSIS — N920 Excessive and frequent menstruation with regular cycle: Secondary | ICD-10-CM | POA: Insufficient documentation

## 2016-02-05 DIAGNOSIS — D509 Iron deficiency anemia, unspecified: Secondary | ICD-10-CM | POA: Insufficient documentation

## 2016-02-05 DIAGNOSIS — D500 Iron deficiency anemia secondary to blood loss (chronic): Secondary | ICD-10-CM

## 2016-02-05 LAB — CBC WITH DIFFERENTIAL/PLATELET
Basophils Absolute: 0 10*3/uL (ref 0–0.1)
Basophils Relative: 1 %
Eosinophils Absolute: 0.1 10*3/uL (ref 0–0.7)
Eosinophils Relative: 4 %
HCT: 36.1 % (ref 35.0–47.0)
Hemoglobin: 11.4 g/dL — ABNORMAL LOW (ref 12.0–16.0)
LYMPHS PCT: 23 %
Lymphs Abs: 0.7 10*3/uL — ABNORMAL LOW (ref 1.0–3.6)
MCH: 26.6 pg (ref 26.0–34.0)
MCHC: 31.5 g/dL — AB (ref 32.0–36.0)
MCV: 84.7 fL (ref 80.0–100.0)
Monocytes Absolute: 0.5 10*3/uL (ref 0.2–0.9)
Monocytes Relative: 15 %
NEUTROS ABS: 1.8 10*3/uL (ref 1.4–6.5)
NEUTROS PCT: 57 %
Platelets: 223 10*3/uL (ref 150–440)
RBC: 4.26 MIL/uL (ref 3.80–5.20)
RDW: 13.3 % (ref 11.5–14.5)
WBC: 3.1 10*3/uL — AB (ref 3.6–11.0)

## 2016-02-05 LAB — SAMPLE TO BLOOD BANK

## 2016-02-05 LAB — FERRITIN: FERRITIN: 59 ng/mL (ref 11–307)

## 2016-02-06 ENCOUNTER — Inpatient Hospital Stay: Payer: BLUE CROSS/BLUE SHIELD

## 2016-02-06 ENCOUNTER — Inpatient Hospital Stay (HOSPITAL_BASED_OUTPATIENT_CLINIC_OR_DEPARTMENT_OTHER): Payer: BLUE CROSS/BLUE SHIELD | Admitting: Family Medicine

## 2016-02-06 VITALS — BP 154/77 | HR 51 | Temp 96.7°F | Wt 142.4 lb

## 2016-02-06 DIAGNOSIS — Z803 Family history of malignant neoplasm of breast: Secondary | ICD-10-CM | POA: Diagnosis not present

## 2016-02-06 DIAGNOSIS — D509 Iron deficiency anemia, unspecified: Secondary | ICD-10-CM

## 2016-02-06 DIAGNOSIS — D500 Iron deficiency anemia secondary to blood loss (chronic): Secondary | ICD-10-CM

## 2016-02-06 DIAGNOSIS — N920 Excessive and frequent menstruation with regular cycle: Secondary | ICD-10-CM | POA: Diagnosis not present

## 2016-02-06 NOTE — Progress Notes (Signed)
[place]  Telephone:(336) [phone]  Fax:(336) [phone]     Priscilla Fernandez DOB: 02-02-1971  MR#: [id number]  [id number]  Patient Care Team: No Pcp Per Patient as PCP - General (General Practice) Seeplaputhur Robinette Haines, MD (General Surgery) Gae Dry, MD as Referring Physician (Obstetrics and Gynecology)  CHIEF COMPLAINT:  Chief Complaint  Patient presents with  . Follow-up    IDA    INTERVAL HISTORY:  Patient is here for further follow up and treatment consideration regarding persistent iron deficiency anemia. She previously was seen in our clinic in 2007-2010, and was treated with intravenous Feraheme for iron deficiency. She also was on oral iron supplementation for short period of time, and was able to tolerate that well. Then she was lost to follow-up. She claims that she does not have any gastrointestinal bleeding, and the color of her stool is normal. She continues with regular menstrual cycles that last approximately 4-5 days. Day 1 being extremely heavy and requiring changing of super thin sized pad every 1-2 hours but tapers off following day 1. She previously received 2 units of red blood cells, followed by 2 infusions of Feraheme. She has been taking oral iron supplementation 3 times a day since initial presentation. She has no complaints at this time and overall feels very well.  REVIEW OF SYSTEMS:   Review of Systems  Constitutional: Negative for fever, chills, weight loss, malaise/fatigue and diaphoresis.  HENT: Negative.   Eyes: Negative.   Respiratory: Negative for cough, hemoptysis, sputum production, shortness of breath and wheezing.   Cardiovascular: Negative for chest pain, palpitations, orthopnea, claudication, leg swelling and PND.  Gastrointestinal: Negative for heartburn, nausea, vomiting, abdominal pain, diarrhea, constipation, blood in stool and melena.  Genitourinary: Negative.   Musculoskeletal: Negative.   Skin: Negative.     Neurological: Negative for dizziness, tingling, focal weakness, seizures and weakness.  Endo/Heme/Allergies: Does not bruise/bleed easily.  Psychiatric/Behavioral: Negative for depression. The patient is not nervous/anxious and does not have insomnia.     As per HPI. Otherwise, a complete review of systems is negatve.   PAST MEDICAL HISTORY: Past Medical History  Diagnosis Date  . Anemia   . Breast screening, unspecified 2013    PAST SURGICAL HISTORY: Past Surgical History  Procedure Laterality Date  . Breast tumor removal   2005  . Dilation and curettage of uterus    . Breast surgery Right 12-08-02    fibrocystic    FAMILY HISTORY Family History  Problem Relation Age of Onset  . Breast cancer Maternal Grandmother     GYNECOLOGIC HISTORY:  LMP 01/27/2016   ADVANCED DIRECTIVES:    HEALTH MAINTENANCE: Social History  Substance Use Topics  . Smoking status: Never Smoker   . Smokeless tobacco: Never Used  . Alcohol Use: Yes     Comment: rare     Colonoscopy:  PAP:  Bone density:  Mammogram: 10/2015  No Known Allergies  Current Outpatient Prescriptions  Medication Sig Dispense Refill  . ferrous sulfate 325 (65 FE) MG EC tablet Take 1 tablet (325 mg total) by mouth 3 (three) times daily with meals. 90 tablet 3   No current facility-administered medications for this visit.   Facility-Administered Medications Ordered in Other Visits  Medication Dose Route Frequency Provider Last Rate Last Dose  . 0.9 %  sodium chloride infusion   Intravenous Continuous Dmitriy Berenzon, MD        OBJECTIVE: BP 154/77 mmHg  Pulse 51  Temp(Src) 96.7 F (  35.9 C) (Tympanic)  Wt 142 lb 6.7 oz (64.6 kg)  SpO2 100%   Body mass index is 25.23 kg/(m^2).    ECOG FS:0 - Asymptomatic  General: Well-developed, well-nourished, no acute distress. Eyes: Pink conjunctiva, anicteric sclera. HEENT: Normocephalic, moist mucous membranes, clear oropharnyx. Lungs: Clear to auscultation  bilaterally. Heart: Regular rate and rhythm. No rubs, murmurs, or gallops. Musculoskeletal: No edema, cyanosis, or clubbing. Neuro: Alert, answering all questions appropriately. Cranial nerves grossly intact. Skin: No rashes or petechiae noted. Psych: Normal affect.   LAB RESULTS:  Appointment on 02/05/2016  Component Date Value Ref Range Status  . WBC 02/05/2016 3.1* 3.6 - 11.0 K/uL Final  . RBC 02/05/2016 4.26  3.80 - 5.20 MIL/uL Final  . Hemoglobin 02/05/2016 11.4* 12.0 - 16.0 g/dL Final  . HCT 02/05/2016 36.1  35.0 - 47.0 % Final  . MCV 02/05/2016 84.7  80.0 - 100.0 fL Final  . MCH 02/05/2016 26.6  26.0 - 34.0 pg Final  . MCHC 02/05/2016 31.5* 32.0 - 36.0 g/dL Final  . RDW 02/05/2016 13.3  11.5 - 14.5 % Final  . Platelets 02/05/2016 223  150 - 440 K/uL Final  . Neutrophils Relative % 02/05/2016 57   Final  . Neutro Abs 02/05/2016 1.8  1.4 - 6.5 K/uL Final  . Lymphocytes Relative 02/05/2016 23   Final  . Lymphs Abs 02/05/2016 0.7* 1.0 - 3.6 K/uL Final  . Monocytes Relative 02/05/2016 15   Final  . Monocytes Absolute 02/05/2016 0.5  0.2 - 0.9 K/uL Final  . Eosinophils Relative 02/05/2016 4   Final  . Eosinophils Absolute 02/05/2016 0.1  0 - 0.7 K/uL Final  . Basophils Relative 02/05/2016 1   Final  . Basophils Absolute 02/05/2016 0.0  0 - 0.1 K/uL Final  . Ferritin 02/05/2016 59  11 - 307 ng/mL Final  . Blood Bank Specimen 02/05/2016 SAMPLE AVAILABLE FOR TESTING   Final  . Sample Expiration 02/05/2016 02/08/2016   Final    STUDIES: No results found.  ASSESSMENT:  Anemia, iron deficiency.   PLAN:   1. Anemia. Iron deficiency anemia, likely secondary to menstrual bleeding. Patient has had history of iron deficiency for the past 8 years, but has not been followed regularly and has not received treatment on a consistent basis. She received 2 units of red blood cells and 2 infusions of Feraheme, continuing oral supplementation 3 times a day with significant improvement of iron  stores and hemoglobin. Thalassemia was suspected with low MCV at presentation, however, hemoglobin electrophoresis was negative for beta thalassemia, and genetic testing was negative for alpha thalassemia. At this time MCV has completely normalized. Ferritin levels are also increasing steadily. We will consider repeating Feraheme if ferritin were to drop again. 2. Mild Leukopenia. Intermittent, continue to monitor.  Patient expressed understanding and was in agreement with this plan. She also understands that She can call clinic at any time with any questions, concerns, or complaints.   Dr. Oliva Bustard was available for consultation and review of plan of care for this patient.   Evlyn Kanner, NP   02/06/2016 11:41 AM

## 2016-02-06 NOTE — Progress Notes (Signed)
Here for IDA F/U visit; Feels well. States her energy feels normal. Appetite is good. Sleeps good. Menstral cycles are regular, moderate amount of bleeding with clotting at times. Denies dizzines. Takes 3 iron pills per day; denies constipation or nausea from the iron pills. No visible blood in stools.

## 2016-03-19 ENCOUNTER — Inpatient Hospital Stay: Payer: BLUE CROSS/BLUE SHIELD

## 2016-04-30 ENCOUNTER — Ambulatory Visit: Payer: BLUE CROSS/BLUE SHIELD | Admitting: Family Medicine

## 2016-04-30 ENCOUNTER — Other Ambulatory Visit: Payer: BLUE CROSS/BLUE SHIELD

## 2016-09-09 ENCOUNTER — Other Ambulatory Visit: Payer: Self-pay

## 2016-09-09 DIAGNOSIS — Z1231 Encounter for screening mammogram for malignant neoplasm of breast: Secondary | ICD-10-CM

## 2016-09-15 ENCOUNTER — Ambulatory Visit
Admission: RE | Admit: 2016-09-15 | Discharge: 2016-09-15 | Disposition: A | Discharge status: Home / Self Care | Payer: Self-pay | Source: Ambulatory Visit | Attending: *Deleted | Admitting: *Deleted

## 2016-09-15 ENCOUNTER — Other Ambulatory Visit: Payer: Self-pay | Admitting: *Deleted

## 2016-09-15 DIAGNOSIS — Z9289 Personal history of other medical treatment: Secondary | ICD-10-CM

## 2016-10-28 ENCOUNTER — Ambulatory Visit
Admission: RE | Admit: 2016-10-28 | Discharge: 2016-10-28 | Disposition: A | Discharge status: Home / Self Care | Payer: BLUE CROSS/BLUE SHIELD | Source: Ambulatory Visit | Attending: General Surgery | Admitting: General Surgery

## 2016-10-28 DIAGNOSIS — Z1231 Encounter for screening mammogram for malignant neoplasm of breast: Secondary | ICD-10-CM

## 2016-10-29 ENCOUNTER — Other Ambulatory Visit: Payer: Self-pay | Admitting: General Surgery

## 2016-10-29 DIAGNOSIS — R928 Other abnormal and inconclusive findings on diagnostic imaging of breast: Secondary | ICD-10-CM

## 2016-11-03 ENCOUNTER — Ambulatory Visit: Payer: BLUE CROSS/BLUE SHIELD | Admitting: General Surgery

## 2016-11-23 ENCOUNTER — Ambulatory Visit
Admission: RE | Admit: 2016-11-23 | Discharge: 2016-11-23 | Disposition: A | Discharge status: Home / Self Care | Payer: BLUE CROSS/BLUE SHIELD | Source: Ambulatory Visit | Attending: General Surgery | Admitting: General Surgery

## 2016-11-23 DIAGNOSIS — R928 Other abnormal and inconclusive findings on diagnostic imaging of breast: Secondary | ICD-10-CM

## 2016-11-24 ENCOUNTER — Encounter: Payer: Self-pay | Admitting: *Deleted

## 2016-11-26 ENCOUNTER — Ambulatory Visit: Payer: BLUE CROSS/BLUE SHIELD | Admitting: General Surgery

## 2016-11-30 ENCOUNTER — Inpatient Hospital Stay: Payer: Self-pay

## 2016-11-30 ENCOUNTER — Ambulatory Visit (INDEPENDENT_AMBULATORY_CARE_PROVIDER_SITE_OTHER): Payer: BLUE CROSS/BLUE SHIELD | Admitting: General Surgery

## 2016-11-30 ENCOUNTER — Encounter: Payer: Self-pay | Admitting: General Surgery

## 2016-11-30 VITALS — BP 124/78 | HR 78 | Resp 12 | Ht 67.0 in | Wt 143.0 lb

## 2016-11-30 DIAGNOSIS — N6310 Unspecified lump in the right breast, unspecified quadrant: Secondary | ICD-10-CM

## 2016-11-30 DIAGNOSIS — N6019 Diffuse cystic mastopathy of unspecified breast: Secondary | ICD-10-CM | POA: Diagnosis not present

## 2016-11-30 NOTE — Patient Instructions (Signed)
Patient to have a MRI done.

## 2016-11-30 NOTE — Progress Notes (Signed)
Patient ID: Priscilla Fernandez, female   DOB: 09/11/1971, 46 y.o.   MRN: [id number]  Chief Complaint  Patient presents with  . Follow-up    HPI Priscilla Fernandez is a 46 y.o. female who presents for a breast evaluation. The most recent mammogram was done on 10/28/2016.   Patient does perform regular self breast checks and gets regular mammograms done.   I have reviewed the history of present illness with the patient.  HPI  Past Medical History:  Diagnosis Date  . Anemia   . Breast screening, unspecified 2013    Past Surgical History:  Procedure Laterality Date  . BREAST BIOPSY Bilateral 2004, 2005   multiple  . BREAST SURGERY Right 12-08-02   fibrocystic  . breast tumor removal   2005  . DILATION AND CURETTAGE OF UTERUS      Family History  Problem Relation Age of Onset  . Breast cancer Maternal Grandmother 70    Social History Social History  Substance Use Topics  . Smoking status: Never Smoker  . Smokeless tobacco: Never Used  . Alcohol use Yes     Comment: rare    No Known Allergies  Current Outpatient Prescriptions  Medication Sig Dispense Refill  . Iron-Vitamin C (IRON 100/C PO) Take by mouth.     No current facility-administered medications for this visit.    Facility-Administered Medications Ordered in Other Visits  Medication Dose Route Frequency Provider Last Rate Last Dose  . 0.9 %  sodium chloride infusion   Intravenous Continuous Roxana Hires, MD        Review of Systems Review of Systems  Constitutional: Negative.   Respiratory: Negative.   Cardiovascular: Negative.     Blood pressure 124/78, pulse 78, resp. rate 12, height 5\' 7"  (1.702 m), weight 143 lb (64.9 kg).  Physical Exam Physical Exam  Constitutional: She is oriented to person, place, and time. She appears well-developed and well-nourished.  Eyes: Conjunctivae are normal. No scleral icterus.  Neck: Neck supple.  Cardiovascular: Normal rate, regular rhythm and normal heart  sounds.   Pulmonary/Chest: Effort normal and breath sounds normal. Right breast exhibits mass. Right breast exhibits no inverted nipple, no nipple discharge, no skin change and no tenderness. Left breast exhibits no inverted nipple, no mass, no nipple discharge, no skin change and no tenderness.    Abdominal: Soft. Normal appearance and bowel sounds are normal. There is no hepatomegaly. There is no tenderness.  Lymphadenopathy:    She has no cervical adenopathy.    She has no axillary adenopathy.  Neurological: She is alert and oriented to person, place, and time.  Skin: Skin is warm and dry.    Data Reviewed Mammogram reviewed vague distortion outer right breast. [place] reported normal but no images available and not sure where the [place] was targeted US repeated today over the palpable thickening- hazy ill defined hypoechoic area with some shadowing. Assessment      Right breast mas 10 :30 to 11 ocl location. Abnormal mammogram Possible scar or fat necrosis. Cannot rulr out other pasthology Plan   Discussed options with patient.  Patient to have a MRI done. This has been arranged at the [facility] for 12-04-16 at 1:55 pm.   This information has been scribed by Gaspar Cola CMA.        Jerre Vandrunen G 11/30/2016, 3:47 PM

## 2016-12-03 ENCOUNTER — Telehealth: Payer: Self-pay | Admitting: *Deleted

## 2016-12-03 NOTE — Telephone Encounter (Signed)
Patient's bilateral breast MRI has not been approved by her insurance company yet. This was originally scheduled for 12-04-16.   This patient has been notified. We will cancel MRI for now until approval is received.   I did speak with Manus Gunning at the [facility] to cancel MRI.   Also, cancelled office visit follow up with Dr. Jamal Collin until MRI is rescheduled.

## 2016-12-04 ENCOUNTER — Other Ambulatory Visit: Payer: BLUE CROSS/BLUE SHIELD

## 2016-12-08 ENCOUNTER — Telehealth: Payer: Self-pay | Admitting: *Deleted

## 2016-12-08 NOTE — Telephone Encounter (Signed)
Patient had her MRI scheduled but was canceled due to [facility] denying it. Patient stated that she called her insurance company and said that our office could do a peer to peer. She wanted to see if we would be willing to do that so she can possibly have her MRI.

## 2016-12-10 ENCOUNTER — Ambulatory Visit: Payer: BLUE CROSS/BLUE SHIELD | Admitting: General Surgery

## 2016-12-16 ENCOUNTER — Inpatient Hospital Stay: Payer: Self-pay

## 2016-12-16 ENCOUNTER — Ambulatory Visit (INDEPENDENT_AMBULATORY_CARE_PROVIDER_SITE_OTHER): Payer: BLUE CROSS/BLUE SHIELD | Admitting: General Surgery

## 2016-12-16 ENCOUNTER — Encounter: Payer: Self-pay | Admitting: General Surgery

## 2016-12-16 VITALS — BP 152/80 | HR 64 | Resp 12 | Ht 62.0 in | Wt 145.0 lb

## 2016-12-16 DIAGNOSIS — N6310 Unspecified lump in the right breast, unspecified quadrant: Secondary | ICD-10-CM

## 2016-12-16 HISTORY — PX: BREAST BIOPSY: SHX20

## 2016-12-16 NOTE — Patient Instructions (Addendum)

## 2016-12-16 NOTE — Progress Notes (Signed)
Patient ID: Priscilla Fernandez, female   DOB: 11-Apr-1971, 46 y.o.   MRN: [id number]  Chief Complaint  Patient presents with  . Follow-up    HPI Priscilla Fernandez is a 46 y.o. female.  Here today for follow up right breast mass. Mammogram and ultrasound was 11-23-16 and 11-30-16.  Request for MRI breast was denied by insurance. Here to discuss options. I have reviewed the history of present illness with the patient.  HPI  Past Medical History:  Diagnosis Date  . Anemia   . Breast screening, unspecified 2013    Past Surgical History:  Procedure Laterality Date  . BREAST BIOPSY Bilateral 2004, 2005   multiple  . BREAST SURGERY Right 12-08-02   fibrocystic  . breast tumor removal   2005  . DILATION AND CURETTAGE OF UTERUS      Family History  Problem Relation Age of Onset  . Breast cancer Maternal Grandmother 70    Social History Social History  Substance Use Topics  . Smoking status: Never Smoker  . Smokeless tobacco: Never Used  . Alcohol use Yes     Comment: rare    No Known Allergies  Current Outpatient Prescriptions  Medication Sig Dispense Refill  . Iron-Vitamin C (IRON 100/C PO) Take by mouth.     No current facility-administered medications for this visit.    Facility-Administered Medications Ordered in Other Visits  Medication Dose Route Frequency Provider Last Rate Last Dose  . 0.9 %  sodium chloride infusion   Intravenous Continuous Roxana Hires, MD        Review of Systems Review of Systems  Constitutional: Negative.   Respiratory: Negative.   Cardiovascular: Negative.     Blood pressure (!) 152/80, pulse 64, resp. rate 12, height 5\' 2"  (1.575 m), weight 145 lb (65.8 kg), last menstrual period 12/14/2016.  Physical Exam Physical Exam  Constitutional: She is oriented to person, place, and time. She appears well-developed and well-nourished.  Pulmonary/Chest: Right breast exhibits mass. Right breast exhibits no inverted nipple, no nipple  discharge, no skin change and no tenderness.  Neurological: She is alert and oriented to person, place, and time.  Skin: Skin is warm and dry.  Psychiatric: Her behavior is normal.  Ill defined firm 2 cm mass at 10-11 ocl right breast  Data Reviewed MRI and ultrasound reviewed  Assessment    Mass right breast. US shows an ill defined area of shadowing at site of the palpable mass.  Recommended core biopsy with clip placement and subsequent mammogram to see if clip is in area of asymmetry seen on mammogram After full discussion pt was agreeable. Plan    CORE BREAST BIOPSY REPORT  Name:  Priscilla Fernandez DOB:  [birth year]  Vital signs:BP (!) 152/80   Pulse 64   Resp 12   Ht 5\' 2"  (1.575 m)   Wt 145 lb (65.8 kg)   LMP 12/14/2016   BMI 26.52 kg/m   Lesion:  mass  Location:   Right  10-11o'clock position  Local anesthetic:   33ml of 1% Xylocaine/0.5% Marcaine  Prep:  Chloro prep  Device:  14Gauge   Bard    Ultrasound guidance:  used  Patient tolerance:  Patient tolerated the procedure well   Approach: LM  Clip: placed  Dressing: Steri strips, telfa and tegaderm  Ice pack applied. Written instructions provided to patient regarding wound care.   Patient advised that patient will be contacted by phone when pathology report is available.  Followup  appointment to be scheduled after pathology.   CC: No PCP Per Patient, Rondell Reams    Will notify patient when pathology is available.       This information has been scribed by Karie Fetch RN, BSN,BC.  Landers Prajapati G 12/18/2016, 8:31 AM

## 2016-12-22 ENCOUNTER — Telehealth: Payer: Self-pay | Admitting: *Deleted

## 2016-12-22 NOTE — Telephone Encounter (Signed)
I confirmed that pt had been notified, patient pleased. Discussed follow-up appointments, patient agrees

## 2016-12-22 NOTE — Telephone Encounter (Signed)
-----   Message from Christene Lye, MD sent at 12/18/2016  8:39 AM EST ----- Path showing PASH, FCD and adenosis. This concordant with palpable and US findings. Pt  To be notified. These are benign findings.

## 2017-01-11 ENCOUNTER — Ambulatory Visit
Admission: RE | Admit: 2017-01-11 | Discharge: 2017-01-11 | Disposition: A | Discharge status: Home / Self Care | Payer: BLUE CROSS/BLUE SHIELD | Source: Ambulatory Visit | Attending: General Surgery | Admitting: General Surgery

## 2017-01-11 DIAGNOSIS — N6310 Unspecified lump in the right breast, unspecified quadrant: Secondary | ICD-10-CM | POA: Diagnosis present

## 2017-01-14 ENCOUNTER — Ambulatory Visit (INDEPENDENT_AMBULATORY_CARE_PROVIDER_SITE_OTHER): Payer: BLUE CROSS/BLUE SHIELD | Admitting: General Surgery

## 2017-01-14 VITALS — BP 144/70 | HR 76 | Resp 12 | Ht 62.0 in | Wt 146.0 lb

## 2017-01-14 DIAGNOSIS — N6019 Diffuse cystic mastopathy of unspecified breast: Secondary | ICD-10-CM

## 2017-01-14 DIAGNOSIS — N6011 Diffuse cystic mastopathy of right breast: Secondary | ICD-10-CM

## 2017-01-14 NOTE — Patient Instructions (Signed)
Patient to return in July, 2018 with right breast diagnotic mammogram

## 2017-01-14 NOTE — Progress Notes (Signed)
Patient ID: Priscilla Fernandez, female   DOB: November 19, 1970, 46 y.o.   MRN: [id number]  Chief Complaint  Patient presents with  . Follow-up    HPI Priscilla Fernandez is a 46 y.o. female who presents for a breast evaluation. The most recent mammogram was done on 01/11/2017.  Patient does perform regular self breast checks and gets regular mammograms done.  Few weeks ago she had core biopsy of a palpable thickening in uoq of right breast-benign.  I have reviewed the history of present illness with the patient.  HPI  Past Medical History:  Diagnosis Date  . Anemia   . Breast screening, unspecified 2013    Past Surgical History:  Procedure Laterality Date  . BREAST BIOPSY Right 2017   NEG  . BREAST EXCISIONAL BIOPSY Bilateral 2004, 2005   multiple  . BREAST SURGERY Right 12-08-02   fibrocystic  . breast tumor removal   2005  . DILATION AND CURETTAGE OF UTERUS      Family History  Problem Relation Age of Onset  . Breast cancer Maternal Grandmother 70    Social History Social History  Substance Use Topics  . Smoking status: Never Smoker  . Smokeless tobacco: Never Used  . Alcohol use Yes     Comment: rare    No Known Allergies  Current Outpatient Prescriptions  Medication Sig Dispense Refill  . Iron-Vitamin C (IRON 100/C PO) Take by mouth.     No current facility-administered medications for this visit.    Facility-Administered Medications Ordered in Other Visits  Medication Dose Route Frequency Provider Last Rate Last Dose  . 0.9 %  sodium chloride infusion   Intravenous Continuous Roxana Hires, MD        Review of Systems Review of Systems  Constitutional: Negative.   Respiratory: Negative.   Cardiovascular: Negative.     Blood pressure (!) 144/70, pulse 76, resp. rate 12, height 5\' 2"  (1.575 m), weight 146 lb (66.2 kg), last menstrual period 01/11/2017.  Physical Exam Physical Exam  Constitutional: She is oriented to person, place, and time. She appears  well-developed and well-nourished.  Eyes: Conjunctivae are normal. No scleral icterus.  Neck: Neck supple.  Pulmonary/Chest: Right breast exhibits mass. Right breast exhibits no inverted nipple, no nipple discharge, no skin change and no tenderness. Left breast exhibits no inverted nipple, no mass, no nipple discharge, no skin change and no tenderness.    Lymphadenopathy:    She has no cervical adenopathy.  Neurological: She is alert and oriented to person, place, and time.  Skin: Skin is warm and dry.    Data Reviewed Images reviewed  Clip is in area of concern seen on initial mammogram of 11/23/16 Assessment    Right breast mass- biopsied and clip is in place. Path- benign FCD and Lovilia Plan    Patient to return in July, 2018 with right breast diagnotic mammogram .     This information has been scribed by Gaspar Cola CMA.    SANKAR,SEEPLAPUTHUR G 01/18/2017, 8:21 AM

## 2017-02-02 ENCOUNTER — Other Ambulatory Visit: Payer: Self-pay | Admitting: Internal Medicine

## 2017-02-03 ENCOUNTER — Encounter: Payer: Self-pay | Admitting: *Deleted

## 2017-02-03 ENCOUNTER — Ambulatory Visit
Admission: EM | Admit: 2017-02-03 | Discharge: 2017-02-03 | Disposition: A | Discharge status: Home / Self Care | Payer: BLUE CROSS/BLUE SHIELD | Attending: Family Medicine | Admitting: Family Medicine

## 2017-02-03 DIAGNOSIS — J0100 Acute maxillary sinusitis, unspecified: Secondary | ICD-10-CM | POA: Diagnosis not present

## 2017-02-03 MED ORDER — AMOXICILLIN 875 MG PO TABS: 875.0000 mg | ORAL_TABLET | Freq: Two times a day (BID) | ORAL | 0 refills | Status: DC

## 2017-02-03 NOTE — ED Provider Notes (Signed)
[facility]    CSN: [id number] Arrival date & time: 02/03/17  1503     History   Chief Complaint Chief Complaint  Patient presents with  . Nasal Congestion  . Sore Throat  . Fatigue    HPI Priscilla Fernandez is a 46 y.o. female.   The history is provided by the patient.  Sore Throat  Associated symptoms include headaches.  URI  Presenting symptoms: congestion, facial pain, fatigue and sore throat   Severity:  Moderate Onset quality:  Sudden Duration:  7 days Timing:  Constant Progression:  Worsening Chronicity:  New Relieved by:  Nothing Ineffective treatments:  OTC medications Associated symptoms: headaches and sinus pain   Risk factors: not elderly, no chronic cardiac disease, no chronic kidney disease, no chronic respiratory disease, no diabetes mellitus, no immunosuppression, no recent illness, no recent travel and no sick contacts     Past Medical History:  Diagnosis Date  . Anemia   . Breast screening, unspecified 2013    Patient Active Problem List   Diagnosis Date Noted  . Iron deficiency anemia due to chronic blood loss 11/14/2015  . Fibrocystic breast disease 01/18/2014  . Family history of breast cancer 01/18/2014    Past Surgical History:  Procedure Laterality Date  . BREAST BIOPSY Right 2017   NEG  . BREAST EXCISIONAL BIOPSY Bilateral 2004, 2005   multiple  . BREAST SURGERY Right 12-08-02   fibrocystic  . breast tumor removal   2005  . DILATION AND CURETTAGE OF UTERUS      OB History    Gravida Para Term Preterm AB Living   3 2     1 1    SAB TAB Ectopic Multiple Live Births   1              Obstetric Comments   First pregnancy 72 Age first period 50       Home Medications    Prior to Admission medications   Medication Sig Start Date End Date Taking? Authorizing Provider  amoxicillin (AMOXIL) 875 MG tablet Take 1 tablet (875 mg total) by mouth 2 (two) times daily. 02/03/17   Norval Gable, MD  Iron-Vitamin C (IRON  100/C PO) Take by mouth.    Historical Provider, MD    Family History Family History  Problem Relation Age of Onset  . Breast cancer Maternal Grandmother 70    Social History Social History  Substance Use Topics  . Smoking status: Never Smoker  . Smokeless tobacco: Never Used  . Alcohol use Yes     Comment: rare     Allergies   Patient has no known allergies.   Review of Systems Review of Systems  Constitutional: Positive for fatigue.  HENT: Positive for congestion, sinus pain and sore throat.   Neurological: Positive for headaches.     Physical Exam Triage Vital Signs ED Triage Vitals  Enc Vitals Group     BP 02/03/17 1516 (!) 171/99     Pulse Rate 02/03/17 1516 61     Resp 02/03/17 1516 16     Temp 02/03/17 1516 98.8 F (37.1 C)     Temp Source 02/03/17 1516 Oral     SpO2 02/03/17 1516 100 %     Weight 02/03/17 1518 140 lb (63.5 kg)     Height 02/03/17 1518 5\' 4"  (1.626 m)     Head Circumference --      Peak Flow --      Pain Score  02/03/17 1652 0     Pain Loc --      Pain Edu? --      Excl. in [place]? --    No data found.   Updated Vital Signs BP (!) 171/99 (BP Location: Left Arm)   Pulse 61   Temp 98.8 F (37.1 C) (Oral)   Resp 16   Ht 5\' 4"  (1.626 m)   Wt 140 lb (63.5 kg)   LMP 01/11/2017   SpO2 100%   BMI 24.03 kg/m   Visual Acuity Right Eye Distance:   Left Eye Distance:   Bilateral Distance:    Right Eye Near:   Left Eye Near:    Bilateral Near:     Physical Exam  Constitutional: She appears well-developed and well-nourished. No distress.  HENT:  Head: Normocephalic and atraumatic.  Right Ear: Tympanic membrane, external ear and ear canal normal.  Left Ear: Tympanic membrane, external ear and ear canal normal.  Nose: Mucosal edema and rhinorrhea present. No nose lacerations, sinus tenderness, nasal deformity, septal deviation or nasal septal hematoma. No epistaxis.  No foreign bodies. Right sinus exhibits maxillary sinus tenderness  and frontal sinus tenderness. Left sinus exhibits maxillary sinus tenderness and frontal sinus tenderness.  Mouth/Throat: Uvula is midline, oropharynx is clear and moist and mucous membranes are normal. No oropharyngeal exudate.  Eyes: Conjunctivae and EOM are normal. Pupils are equal, round, and reactive to light. Right eye exhibits no discharge. Left eye exhibits no discharge. No scleral icterus.  Neck: Normal range of motion. Neck supple. No thyromegaly present.  Cardiovascular: Normal rate, regular rhythm and normal heart sounds.   Pulmonary/Chest: Effort normal and breath sounds normal. No respiratory distress. She has no wheezes. She has no rales.  Lymphadenopathy:    She has no cervical adenopathy.  Skin: She is not diaphoretic.  Nursing note and vitals reviewed.    UC Treatments / Results  Labs (all labs ordered are listed, but only abnormal results are displayed) Labs Reviewed - No data to display  EKG  EKG Interpretation None       Radiology No results found.  Procedures Procedures (including critical care time)  Medications Ordered in UC Medications - No data to display   Initial Impression / Assessment and Plan / UC Course  I have reviewed the triage vital signs and the nursing notes.  Pertinent labs & imaging results that were available during my care of the patient were reviewed by me and considered in my medical decision making (see chart for details).      Final Clinical Impressions(s) / UC Diagnoses   Final diagnoses:  Acute maxillary sinusitis, recurrence not specified    New Prescriptions Discharge Medication List as of 02/03/2017  4:50 PM    START taking these medications   Details  amoxicillin (AMOXIL) 875 MG tablet Take 1 tablet (875 mg total) by mouth 2 (two) times daily., Starting Wed 02/03/2017, Normal       1.diagnosis reviewed with patient 2. rx as per orders above; reviewed possible side effects, interactions, risks and benefits    3. Recommend supportive treatment with otc flonase 4. Follow-up prn if symptoms worsen or don't improve   Norval Gable, MD 02/03/17 [phone]

## 2017-02-03 NOTE — ED Triage Notes (Signed)
Head congestion, mild sore throat, and fatigue since Sunday. OTC meds no longer working.

## 2017-03-15 ENCOUNTER — Other Ambulatory Visit: Payer: Self-pay

## 2017-03-15 DIAGNOSIS — N6019 Diffuse cystic mastopathy of unspecified breast: Secondary | ICD-10-CM

## 2017-05-17 ENCOUNTER — Other Ambulatory Visit: Payer: Self-pay | Admitting: General Surgery

## 2017-05-17 ENCOUNTER — Ambulatory Visit
Admission: RE | Admit: 2017-05-17 | Discharge: 2017-05-17 | Disposition: A | Discharge status: Home / Self Care | Payer: 59 | Source: Ambulatory Visit | Attending: General Surgery | Admitting: General Surgery

## 2017-05-17 DIAGNOSIS — N6019 Diffuse cystic mastopathy of unspecified breast: Secondary | ICD-10-CM

## 2017-05-24 ENCOUNTER — Telehealth: Payer: Self-pay | Admitting: *Deleted

## 2017-05-24 NOTE — Telephone Encounter (Signed)
Patient had a mammogram done on 05/17/17-she was told she needed to have a right breast biopsy. Patient has an appointment scheduled for 05/27/17 with you. She is nervous and wanted to speak with you regarding the recommendation. She is aware you were out of the office and will get back with her as soon as possible. Please advise. Thank you.

## 2017-05-27 ENCOUNTER — Ambulatory Visit (INDEPENDENT_AMBULATORY_CARE_PROVIDER_SITE_OTHER): Payer: 59 | Admitting: General Surgery

## 2017-05-27 ENCOUNTER — Encounter: Payer: Self-pay | Admitting: General Surgery

## 2017-05-27 ENCOUNTER — Other Ambulatory Visit: Payer: Self-pay | Admitting: General Surgery

## 2017-05-27 VITALS — BP 138/82 | HR 74 | Resp 12 | Ht 66.0 in | Wt 143.0 lb

## 2017-05-27 DIAGNOSIS — N6310 Unspecified lump in the right breast, unspecified quadrant: Secondary | ICD-10-CM

## 2017-05-27 DIAGNOSIS — N6311 Unspecified lump in the right breast, upper outer quadrant: Secondary | ICD-10-CM | POA: Diagnosis not present

## 2017-05-27 NOTE — Patient Instructions (Signed)
Patient to have a right breast biopsy. The patient is aware to call back for any questions or concerns.

## 2017-05-27 NOTE — Progress Notes (Signed)
Patient ID: Priscilla Fernandez, female   DOB: 12-02-1970, 46 y.o.   MRN: [id number]  Chief Complaint  Patient presents with  . Follow-up    HPI Priscilla Fernandez is a 46 y.o. female who presents for a breast evaluation. The most recent right breast mammogram was done on 05/17/2017 .  Patient does perform regular self breast checks and gets regular mammograms done.    HPI  Past Medical History:  Diagnosis Date  . Anemia   . Breast screening, unspecified 2013    Past Surgical History:  Procedure Laterality Date  . BREAST BIOPSY Right 12/17/2015   NEG  . BREAST EXCISIONAL BIOPSY Bilateral 2004, 2005   multiple  . BREAST SURGERY Right 12-08-02   fibrocystic  . breast tumor removal   2005  . DILATION AND CURETTAGE OF UTERUS      Family History  Problem Relation Age of Onset  . Breast cancer Maternal Grandmother 70    Social History Social History  Substance Use Topics  . Smoking status: Never Smoker  . Smokeless tobacco: Never Used  . Alcohol use Yes     Comment: rare    No Known Allergies  Current Outpatient Prescriptions  Medication Sig Dispense Refill  . amoxicillin (AMOXIL) 875 MG tablet Take 1 tablet (875 mg total) by mouth 2 (two) times daily. 20 tablet 0  . Iron-Vitamin C (IRON 100/C PO) Take by mouth.     No current facility-administered medications for this visit.    Facility-Administered Medications Ordered in Other Visits  Medication Dose Route Frequency Provider Last Rate Last Dose  . 0.9 %  sodium chloride infusion   Intravenous Continuous Berenzon, Dmitriy, MD        Review of Systems Review of Systems  Constitutional: Negative.   Respiratory: Negative.   Cardiovascular: Negative.     Blood pressure 138/82, pulse 74, resp. rate 12, height 5\' 6"  (1.676 m), weight 143 lb (64.9 kg), last menstrual period 04/28/2017.  Physical Exam Physical Exam  Constitutional: She is oriented to person, place, and time. She appears well-developed and  well-nourished.  Eyes: Conjunctivae are normal. No scleral icterus.  Neck: Neck supple.  Cardiovascular: Normal rate and regular rhythm.   Murmur heard.  Systolic murmur is present with a grade of 3/6  Pulmonary/Chest: Effort normal and breath sounds normal. Right breast exhibits mass. Right breast exhibits no inverted nipple, no nipple discharge, no skin change and no tenderness. Left breast exhibits no inverted nipple, no mass, no nipple discharge, no skin change and no tenderness.    Abdominal: Soft. Normal appearance and bowel sounds are normal. There is no tenderness.  Lymphadenopathy:    She has no cervical adenopathy.    She has no axillary adenopathy.  Neurological: She is alert and oriented to person, place, and time.  Skin: Skin is warm and dry.    Data Reviewed Mammogram reviewed  Assessment    Pt has persistance of an area of distortion in right breast UOQ. Few mos ago she was seen and biopsy of the mass was performed showing PASH. Current mammogram shows clip is 90mm medial to the area of  distortion. To ensure the area of distortion is definitely sampled recommendation made.for tomo guided biopsy    Plan   Above assessment discussed fully with pt. She is agreeable to having the biopsy.    Patient to have a right breast tomo guided biopsy. The patient is aware to call back for any questions or concerns.  HPI, Physical Exam, Assessment and Plan have been scribed under the direction and in the presence of Mckinley Jewel, MD  Gaspar Cola, CMA  I have completed the exam and reviewed the above documentation for accuracy and completeness.  I agree with the above.  Haematologist has been used and any errors in dictation or transcription are unintentional.  Seeplaputhur G. Jamal Collin, M.D., F.A.C.S.      Junie Panning G 06/01/2017, 3:40 PM

## 2017-06-02 ENCOUNTER — Ambulatory Visit
Admission: RE | Admit: 2017-06-02 | Discharge: 2017-06-02 | Disposition: A | Discharge status: Home / Self Care | Payer: 59 | Source: Ambulatory Visit | Attending: General Surgery | Admitting: General Surgery

## 2017-06-02 DIAGNOSIS — N6031 Fibrosclerosis of right breast: Secondary | ICD-10-CM | POA: Insufficient documentation

## 2017-06-02 DIAGNOSIS — N6311 Unspecified lump in the right breast, upper outer quadrant: Secondary | ICD-10-CM | POA: Insufficient documentation

## 2017-06-02 DIAGNOSIS — N6310 Unspecified lump in the right breast, unspecified quadrant: Secondary | ICD-10-CM

## 2017-06-02 HISTORY — PX: BREAST BIOPSY: SHX20

## 2017-06-03 LAB — SURGICAL PATHOLOGY

## 2017-06-09 ENCOUNTER — Encounter: Payer: Self-pay | Admitting: General Surgery

## 2017-06-09 ENCOUNTER — Ambulatory Visit (INDEPENDENT_AMBULATORY_CARE_PROVIDER_SITE_OTHER): Payer: 59 | Admitting: General Surgery

## 2017-06-09 VITALS — BP 122/58 | HR 70 | Resp 14 | Ht 64.0 in | Wt 146.0 lb

## 2017-06-09 DIAGNOSIS — N6489 Other specified disorders of breast: Secondary | ICD-10-CM

## 2017-06-09 DIAGNOSIS — N62 Hypertrophy of breast: Secondary | ICD-10-CM | POA: Diagnosis not present

## 2017-06-09 DIAGNOSIS — N6019 Diffuse cystic mastopathy of unspecified breast: Secondary | ICD-10-CM | POA: Diagnosis not present

## 2017-06-09 DIAGNOSIS — N6310 Unspecified lump in the right breast, unspecified quadrant: Secondary | ICD-10-CM

## 2017-06-09 NOTE — Patient Instructions (Addendum)
The patient will be asked to return to the office in five months with a bilateral diagnostic mammogram.

## 2017-06-09 NOTE — Progress Notes (Signed)
Patient ID: Priscilla Fernandez, female   DOB: 04-Mar-1971, 46 y.o.   MRN: [id number]  Chief Complaint  Patient presents with  . Follow-up    HPI Priscilla Fernandez is a 46 y.o. female.  Follow up 3D right breast biopsy of an area of asymmetry in UOQ right breast. Biopsy of the this region prior showed PASH HPI  Past Medical History:  Diagnosis Date  . Anemia   . Breast screening, unspecified 2013    Past Surgical History:  Procedure Laterality Date  . BREAST BIOPSY Right 12/17/2015   NEG  . BREAST EXCISIONAL BIOPSY Bilateral 2004, 2005   multiple  . BREAST SURGERY Right 12-08-02   fibrocystic  . breast tumor removal   2005  . DILATION AND CURETTAGE OF UTERUS      Family History  Problem Relation Age of Onset  . Breast cancer Maternal Grandmother 70    Social History Social History  Substance Use Topics  . Smoking status: Never Smoker  . Smokeless tobacco: Never Used  . Alcohol use Yes     Comment: rare    No Known Allergies  Current Outpatient Prescriptions  Medication Sig Dispense Refill  . Iron-Vitamin C (IRON 100/C PO) Take by mouth.     No current facility-administered medications for this visit.    Facility-Administered Medications Ordered in Other Visits  Medication Dose Route Frequency Provider Last Rate Last Dose  . 0.9 %  sodium chloride infusion   Intravenous Continuous Berenzon, Dmitriy, MD        Review of Systems Review of Systems  Constitutional: Negative.   Respiratory: Negative.   Cardiovascular: Negative.     Blood pressure (!) 122/58, pulse 70, resp. rate 14, height 5\' 4"  (1.626 m), weight 146 lb (66.2 kg), last menstrual period 05/25/2017.  Physical Exam Physical Exam  Constitutional: She is oriented to person, place, and time. She appears well-developed and well-nourished.  Pulmonary/Chest: Right breast exhibits no inverted nipple, no mass, no nipple discharge, no skin change and no tenderness. Left breast exhibits no inverted  nipple, no mass, no nipple discharge, no skin change and no tenderness. Breasts are symmetrical.  Lymphadenopathy:    She has no axillary adenopathy.  Neurological: She is alert and oriented to person, place, and time.  Skin: Skin is warm and dry.  Psychiatric: She has a normal mood and affect.    Data Reviewed Pathology report   Assessment    Path showed columnar cell change without atypia and fibrosis. In the absence of atypia this type of lesion has a very low pathology upgrade rate on excision and can be followed. Pt advised accordingly.     Plan   The patient will be asked to return to the office in five months with a bilateral diagnostic mammogram.     HPI, Physical Exam, Assessment and Plan have been scribed under the direction and in the presence of Mckinley Jewel, MD Karie Fetch, RN I have completed the exam and reviewed the above documentation for accuracy and completeness.  I agree with the above.  Haematologist has been used and any errors in dictation or transcription are unintentional.  Seeplaputhur G. Jamal Collin, M.D., F.A.C.S.  Junie Panning G 06/10/2017, 4:03 PM

## 2017-09-08 ENCOUNTER — Other Ambulatory Visit: Payer: Self-pay

## 2017-09-08 DIAGNOSIS — N6489 Other specified disorders of breast: Secondary | ICD-10-CM

## 2017-09-20 ENCOUNTER — Encounter: Payer: Self-pay | Admitting: *Deleted

## 2017-09-20 ENCOUNTER — Ambulatory Visit
Admission: EM | Admit: 2017-09-20 | Discharge: 2017-09-20 | Disposition: A | Discharge status: Home / Self Care | Payer: 59 | Attending: Family Medicine | Admitting: Family Medicine

## 2017-09-20 DIAGNOSIS — B9789 Other viral agents as the cause of diseases classified elsewhere: Secondary | ICD-10-CM | POA: Diagnosis not present

## 2017-09-20 DIAGNOSIS — R05 Cough: Secondary | ICD-10-CM

## 2017-09-20 DIAGNOSIS — J069 Acute upper respiratory infection, unspecified: Secondary | ICD-10-CM

## 2017-09-20 LAB — RAPID STREP SCREEN (MED CTR MEBANE ONLY): Streptococcus, Group A Screen (Direct): NEGATIVE

## 2017-09-20 MED ORDER — HYDROCOD POLST-CPM POLST ER 10-8 MG/5ML PO SUER: 5.0000 mL | Freq: Two times a day (BID) | ORAL | 0 refills | Status: DC | PRN

## 2017-09-20 NOTE — ED Triage Notes (Signed)
C/O "flu like symptoms" cough, runny nose ,chills, and body aches. Symptoms started last Friday.

## 2017-09-20 NOTE — ED Provider Notes (Signed)
[facility]    CSN: [id number] Arrival date & time: 09/20/17  0850     History   Chief Complaint Chief Complaint  Patient presents with  . Nasal Congestion  . Cough    HPI Priscilla Fernandez is a 46 y.o. female.   The history is provided by the patient.  URI  Presenting symptoms: congestion, cough, fatigue and rhinorrhea   Severity:  Moderate Onset quality:  Sudden Duration:  3 days Timing:  Constant Progression:  Worsening Chronicity:  New Relieved by:  None tried Ineffective treatments:  None tried Risk factors: sick contacts     Past Medical History:  Diagnosis Date  . Anemia   . Breast screening, unspecified 2013    Patient Active Problem List   Diagnosis Date Noted  . Iron deficiency anemia due to chronic blood loss 11/14/2015  . Fibrocystic breast disease 01/18/2014  . Family history of breast cancer 01/18/2014    Past Surgical History:  Procedure Laterality Date  . BREAST BIOPSY Right 12/17/2015   NEG  . BREAST EXCISIONAL BIOPSY Bilateral 2004, 2005   multiple  . BREAST SURGERY Right 12-08-02   fibrocystic  . breast tumor removal   2005  . DILATION AND CURETTAGE OF UTERUS      OB History    Gravida Para Term Preterm AB Living   3 2     1 1    SAB TAB Ectopic Multiple Live Births   1              Obstetric Comments   First pregnancy 27 Age first period 38       Home Medications    Prior to Admission medications   Medication Sig Start Date End Date Taking? Authorizing Provider  Iron-Vitamin C (IRON 100/C PO) Take by mouth.   Yes [provider]  chlorpheniramine-HYDROcodone (TUSSIONEX PENNKINETIC ER) 10-8 MG/5ML SUER Take 5 mLs every 12 (twelve) hours as needed by mouth. 09/20/17   Norval Gable, MD    Family History Family History  Problem Relation Age of Onset  . Breast cancer Maternal Grandmother 70    Social History Social History   Tobacco Use  . Smoking status: Never Smoker  . Smokeless tobacco:  Never Used  Substance Use Topics  . Alcohol use: Yes    Comment: rare  . Drug use: No     Allergies   Patient has no known allergies.   Review of Systems Review of Systems  Constitutional: Positive for fatigue.  HENT: Positive for congestion and rhinorrhea.   Respiratory: Positive for cough.      Physical Exam Triage Vital Signs ED Triage Vitals  Enc Vitals Group     BP 09/20/17 0921 (!) 160/79     Pulse Rate 09/20/17 0921 69     Resp 09/20/17 0917 12     Temp 09/20/17 0921 98.2 F (36.8 C)     Temp Source 09/20/17 0917 Oral     SpO2 09/20/17 0921 100 %     Weight 09/20/17 0923 140 lb (63.5 kg)     Height 09/20/17 0923 5\' 4"  (1.626 m)     Head Circumference --      Peak Flow --      Pain Score 09/20/17 0923 7     Pain Loc --      Pain Edu? --      Excl. in [place]? --    No data found.  Updated Vital Signs BP (!) 160/79 (BP  Location: Left Arm)   Pulse 69   Temp 98.2 F (36.8 C) (Oral)   Resp 12   Ht 5\' 4"  (1.626 m)   Wt 140 lb (63.5 kg)   LMP 09/10/2017   SpO2 100%   BMI 24.03 kg/m   Visual Acuity Right Eye Distance:   Left Eye Distance:   Bilateral Distance:    Right Eye Near:   Left Eye Near:    Bilateral Near:     Physical Exam  Constitutional: She is oriented to person, place, and time. She appears well-developed and well-nourished. No distress.  HENT:  Head: Normocephalic.  Right Ear: Tympanic membrane, external ear and ear canal normal.  Left Ear: Tympanic membrane, external ear and ear canal normal.  Nose: Nose normal.  Mouth/Throat: Oropharynx is clear and moist and mucous membranes are normal.  Eyes: Conjunctivae and EOM are normal. Pupils are equal, round, and reactive to light. Right eye exhibits no discharge. Left eye exhibits no discharge. No scleral icterus.  Neck: Normal range of motion. Neck supple. No JVD present. No tracheal deviation present. No thyromegaly present.  Cardiovascular: Normal rate, regular rhythm, normal heart  sounds and intact distal pulses.  No murmur heard. Pulmonary/Chest: Effort normal and breath sounds normal. No stridor. No respiratory distress. She has no wheezes. She has no rales. She exhibits no tenderness.  Musculoskeletal: She exhibits no edema or tenderness.  Lymphadenopathy:    She has no cervical adenopathy.  Neurological: She is alert and oriented to person, place, and time. She has normal reflexes.  Skin: Skin is warm and dry. No rash noted. She is not diaphoretic. No erythema. No pallor.  Vitals reviewed.    UC Treatments / Results  Labs (all labs ordered are listed, but only abnormal results are displayed) Labs Reviewed  RAPID STREP SCREEN (NOT AT [facility])  CULTURE, GROUP A STREP [facility])    EKG  EKG Interpretation None       Radiology No results found.  Procedures Procedures (including critical care time)  Medications Ordered in UC Medications - No data to display   Initial Impression / Assessment and Plan / UC Course  I have reviewed the triage vital signs and the nursing notes.  Pertinent labs & imaging results that were available during my care of the patient were reviewed by me and considered in my medical decision making (see chart for details).       Final Clinical Impressions(s) / UC Diagnoses   Final diagnoses:  Viral URI with cough    New Prescriptions This SmartLink is deprecated. Use AVSMEDLIST instead to display the medication list for a patient. 1. Labs/x-ray results and diagnosis reviewed with patient/parent/guardian/family 2. rx as per orders above; reviewed possible side effects, interactions, risks and benefits  3. Recommend supportive treatment with fluids, rest 4. Follow-up prn if symptoms worsen or don't improve  Controlled Substance Prescriptions [place] Controlled Substance Registry consulted? Not Applicable   Norval Gable, MD 09/20/17 1318

## 2017-09-23 LAB — CULTURE, GROUP A STREP (THRC)

## 2017-10-11 DIAGNOSIS — Z0000 Encounter for general adult medical examination without abnormal findings: Secondary | ICD-10-CM | POA: Insufficient documentation

## 2017-10-11 DIAGNOSIS — I10 Essential (primary) hypertension: Secondary | ICD-10-CM | POA: Insufficient documentation

## 2017-10-28 ENCOUNTER — Encounter: Payer: Self-pay | Admitting: Obstetrics & Gynecology

## 2017-11-01 ENCOUNTER — Ambulatory Visit
Admission: RE | Admit: 2017-11-01 | Discharge: 2017-11-01 | Disposition: A | Discharge status: Home / Self Care | Payer: 59 | Source: Ambulatory Visit | Attending: General Surgery | Admitting: General Surgery

## 2017-11-01 DIAGNOSIS — N62 Hypertrophy of breast: Secondary | ICD-10-CM | POA: Diagnosis not present

## 2017-11-01 DIAGNOSIS — N6489 Other specified disorders of breast: Secondary | ICD-10-CM

## 2017-11-04 ENCOUNTER — Encounter: Payer: Self-pay | Admitting: Obstetrics & Gynecology

## 2017-11-04 ENCOUNTER — Ambulatory Visit (INDEPENDENT_AMBULATORY_CARE_PROVIDER_SITE_OTHER): Payer: 59 | Admitting: Obstetrics & Gynecology

## 2017-11-04 VITALS — BP 130/70 | HR 81 | Ht 63.0 in | Wt 136.0 lb

## 2017-11-04 DIAGNOSIS — Z01419 Encounter for gynecological examination (general) (routine) without abnormal findings: Secondary | ICD-10-CM | POA: Diagnosis not present

## 2017-11-04 DIAGNOSIS — Z0000 Encounter for general adult medical examination without abnormal findings: Secondary | ICD-10-CM

## 2017-11-04 DIAGNOSIS — D251 Intramural leiomyoma of uterus: Secondary | ICD-10-CM

## 2017-11-04 NOTE — Progress Notes (Signed)
HPI:      [name] is a 46 y.o. G2P1011 who LMP was Patient's last menstrual period was 10/01/2017., she presents today for her annual examination. The patient has no complaints today. The patient is sexually active. Her last pap: approximate date 2016 and was normal and last mammogram: was normal. The patient does perform self breast exams.  There is no notable family history of breast or ovarian cancer in her family.  The patient has regular exercise: yes.  The patient denies current symptoms of depression.    PMHx: Past Medical History:  Diagnosis Date  . Anemia   . Breast screening, unspecified 2013   Past Surgical History:  Procedure Laterality Date  . BREAST BIOPSY Right 12/16/2016   NEG; PASH  . BREAST BIOPSY Right 06/02/2017   NEG  . BREAST EXCISIONAL BIOPSY Bilateral 2004, 2005   multiple  . BREAST SURGERY Right 12-08-02   fibrocystic  . breast tumor removal   2005  . DILATION AND CURETTAGE OF UTERUS     Family History  Problem Relation Age of Onset  . Hypertension Father   . Breast cancer Maternal Grandmother 80  . Cancer Paternal Grandmother        colon or rectal  . Lung cancer Paternal Grandfather    Social History   Tobacco Use  . Smoking status: Never Smoker  . Smokeless tobacco: Never Used  Substance Use Topics  . Alcohol use: Yes    Comment: rare  . Drug use: No    Current Outpatient Medications:  .  chlorpheniramine-HYDROcodone (TUSSIONEX PENNKINETIC ER) 10-8 MG/5ML SUER, Take 5 mLs every 12 (twelve) hours as needed by mouth., Disp: 120 mL, Rfl: 0 .  Iron-Vitamin C (IRON 100/C PO), Take by mouth., Disp: , Rfl:  No current facility-administered medications for this visit.   Facility-Administered Medications Ordered in Other Visits:  .  0.9 %  sodium chloride infusion, , Intravenous, Continuous, Berenzon, Dmitriy, MD Allergies: Patient has no known allergies.  Review of Systems  Constitutional: Negative for chills, fever and  malaise/fatigue.  HENT: Negative for congestion, sinus pain and sore throat.   Eyes: Negative for blurred vision and pain.  Respiratory: Negative for cough and wheezing.   Cardiovascular: Negative for chest pain and leg swelling.  Gastrointestinal: Negative for abdominal pain, constipation, diarrhea, heartburn, nausea and vomiting.  Genitourinary: Negative for dysuria, frequency, hematuria and urgency.  Musculoskeletal: Negative for back pain, joint pain, myalgias and neck pain.  Skin: Negative for itching and rash.  Neurological: Negative for dizziness, tremors and weakness.  Endo/Heme/Allergies: Does not bruise/bleed easily.  Psychiatric/Behavioral: Negative for depression. The patient is not nervous/anxious and does not have insomnia.     Objective: BP 130/70   Pulse 81   Ht 5\' 3"  (1.6 m)   Wt 136 lb (61.7 kg)   LMP 10/01/2017   BMI 24.09 kg/m   Filed Weights   11/04/17 1432  Weight: 136 lb (61.7 kg)   Body mass index is 24.09 kg/m. Physical Exam  Constitutional: She is oriented to person, place, and time. She appears well-developed and well-nourished. No distress.  Genitourinary: Rectum normal and vagina normal. Pelvic exam was performed with patient supine. There is no rash or lesion on the right labia. There is no rash or lesion on the left labia. Vagina exhibits no lesion. No bleeding in the vagina. Right adnexum does not display mass and does not display tenderness. Left adnexum does not display mass and does not  display tenderness. Cervix does not exhibit motion tenderness, lesion, friability or polyp.   Uterus is enlarged, exhibiting a mass, mobile and anteverted.  Genitourinary Comments: 10 week size uterus anteverted  HENT:  Head: Normocephalic and atraumatic. Head is without laceration.  Right Ear: Hearing normal.  Left Ear: Hearing normal.  Nose: No epistaxis.  No foreign bodies.  Mouth/Throat: Uvula is midline, oropharynx is clear and moist and mucous membranes  are normal.  Eyes: Pupils are equal, round, and reactive to light.  Neck: Normal range of motion. Neck supple. No thyromegaly present.  Cardiovascular: Normal rate and regular rhythm. Exam reveals no gallop and no friction rub.  No murmur heard. Pulmonary/Chest: Effort normal and breath sounds normal. No respiratory distress. She has no wheezes. Right breast exhibits no mass, no skin change and no tenderness. Left breast exhibits no mass, no skin change and no tenderness.  Abdominal: Soft. Bowel sounds are normal. She exhibits no distension. There is no tenderness. There is no rebound.  Musculoskeletal: Normal range of motion.  Neurological: She is alert and oriented to person, place, and time. No cranial nerve deficit.  Skin: Skin is warm and dry.  Psychiatric: She has a normal mood and affect. Judgment normal.  Vitals reviewed.   Assessment:  ANNUAL EXAM 1. Annual physical exam   2. Intramural leiomyoma of uterus    Screening Plan:            1.  Cervical Screening-  Pap smear schedule reviewed with patient  2. Breast screening- Exam annually and mammogram>40 planned   3. Colonoscopy every 10 years, Hemoccult testing - after age 71  4. Labs managed by PCP  5. Counseling for contraception: no method   6. Intramural leiomyoma of uterus, monitor for sx's    F/U  Return in about 1 year (around 11/04/2018) for Annual.  Barnett Applebaum, MD, [facility], [facility] 11/04/2017  2:56 PM

## 2017-11-04 NOTE — Patient Instructions (Signed)
PAP every three years Mammogram every year Labs  (with PCP)

## 2017-11-11 ENCOUNTER — Ambulatory Visit: Payer: 59 | Admitting: General Surgery

## 2017-11-11 VITALS — BP 120/68 | HR 64 | Resp 12 | Ht 64.0 in | Wt 141.0 lb

## 2017-11-11 DIAGNOSIS — N6012 Diffuse cystic mastopathy of left breast: Secondary | ICD-10-CM | POA: Diagnosis not present

## 2017-11-11 DIAGNOSIS — N6011 Diffuse cystic mastopathy of right breast: Secondary | ICD-10-CM

## 2017-11-11 DIAGNOSIS — N62 Hypertrophy of breast: Secondary | ICD-10-CM

## 2017-11-11 DIAGNOSIS — N6489 Other specified disorders of breast: Secondary | ICD-10-CM

## 2017-11-11 NOTE — Patient Instructions (Addendum)
The patient is aware to call back for any questions or concerns.  Patient will be asked to return to Dr Posey Pronto in one year with a bilateral screening mammogram.

## 2017-11-11 NOTE — Progress Notes (Signed)
Patient ID: Priscilla Fernandez, female   DOB: 02/16/1971, 46 y.o.   MRN: [id number]  Chief Complaint  Patient presents with  . Follow-up    HPI Priscilla Fernandez is a 46 y.o. female.  who presents for her follow up right breast biopsy and a breast evaluation. The most recent mammogram was done on 11-01-17.  Patient does perform regular self breast checks and gets regular mammograms done.     HPI  Past Medical History:  Diagnosis Date  . Anemia   . Breast screening, unspecified 2013    Past Surgical History:  Procedure Laterality Date  . BREAST BIOPSY Right 12/16/2016   NEG; PASH  . BREAST BIOPSY Right 06/02/2017   NEG  . BREAST EXCISIONAL BIOPSY Bilateral 2004, 2005   multiple  . BREAST SURGERY Right 12-08-02   fibrocystic  . breast tumor removal   2005  . DILATION AND CURETTAGE OF UTERUS      Family History  Problem Relation Age of Onset  . Hypertension Father   . Breast cancer Maternal Grandmother 40  . Cancer Paternal Grandmother        colon or rectal  . Lung cancer Paternal Grandfather     Social History Social History   Tobacco Use  . Smoking status: Never Smoker  . Smokeless tobacco: Never Used  Substance Use Topics  . Alcohol use: Yes    Comment: rare  . Drug use: No    No Known Allergies  Current Outpatient Medications  Medication Sig Dispense Refill  . chlorpheniramine-HYDROcodone (TUSSIONEX PENNKINETIC ER) 10-8 MG/5ML SUER Take 5 mLs every 12 (twelve) hours as needed by mouth. 120 mL 0  . ferrous sulfate 325 (65 FE) MG EC tablet Take 325 mg by mouth.     No current facility-administered medications for this visit.    Facility-Administered Medications Ordered in Other Visits  Medication Dose Route Frequency Provider Last Rate Last Dose  . 0.9 %  sodium chloride infusion   Intravenous Continuous Berenzon, Dmitriy, MD        Review of Systems Review of Systems  Constitutional: Negative.   Respiratory: Negative.   Cardiovascular: Negative.      Blood pressure 120/68, pulse 64, resp. rate 12, height 5\' 4"  (1.626 m), weight 141 lb (64 kg).  Physical Exam Physical Exam  Constitutional: She is oriented to person, place, and time. She appears well-developed and well-nourished.  HENT:  Mouth/Throat: Oropharynx is clear and moist.  Eyes: Conjunctivae are normal. No scleral icterus.  Neck: Neck supple.  Cardiovascular: Normal rate and regular rhythm.  Murmur heard.  Systolic murmur is present with a grade of 3/6. Pulmonary/Chest: Effort normal and breath sounds normal. No respiratory distress. Right breast exhibits no inverted nipple, no mass, no nipple discharge, no skin change and no tenderness. Left breast exhibits no inverted nipple, no mass, no nipple discharge, no skin change and no tenderness.  Abdominal: Soft. There is no tenderness.  Lymphadenopathy:    She has no cervical adenopathy.    She has no axillary adenopathy.  Neurological: She is alert and oriented to person, place, and time.  Skin: Skin is warm and dry.  Psychiatric: Her behavior is normal.    Data Reviewed  Mammogram reviewed and stable  Assessment    Right breast mass- Path- benign FCD and [place] Partner which has been evaluated with cardiac echo with no apparent valvular abnormality    Plan    Patient will be asked to return to Dr Posey Pronto  in one year with a bilateral screening mammogram.      HPI, Physical Exam, Assessment and Plan have been scribed under the direction and in the presence of Mckinley Jewel, MD Karie Fetch, RN I have completed the exam and reviewed the above documentation for accuracy and completeness.  I agree with the above.  Haematologist has been used and any errors in dictation or transcription are unintentional.  Adreena Willits G. Jamal Collin, M.D., F.A.C.S.   Junie Panning G 11/12/2017, 8:42 AM

## 2018-04-12 DIAGNOSIS — D509 Iron deficiency anemia, unspecified: Secondary | ICD-10-CM | POA: Diagnosis not present

## 2018-05-14 ENCOUNTER — Other Ambulatory Visit: Payer: Self-pay

## 2018-05-14 ENCOUNTER — Ambulatory Visit
Admission: EM | Admit: 2018-05-14 | Discharge: 2018-05-14 | Disposition: A | Discharge status: Home / Self Care | Payer: 59 | Attending: Family Medicine | Admitting: Family Medicine

## 2018-05-14 ENCOUNTER — Encounter: Payer: Self-pay | Admitting: Gynecology

## 2018-05-14 DIAGNOSIS — J069 Acute upper respiratory infection, unspecified: Secondary | ICD-10-CM

## 2018-05-14 MED ORDER — FLUTICASONE PROPIONATE 50 MCG/ACT NA SUSP: 1.0000 | Freq: Every day | NASAL | 2 refills | Status: DC

## 2018-05-14 MED ORDER — AMOXICILLIN-POT CLAVULANATE 875-125 MG PO TABS: 1.0000 | ORAL_TABLET | Freq: Two times a day (BID) | ORAL | 0 refills | Status: AC

## 2018-05-14 MED ORDER — IBUPROFEN 800 MG PO TABS: 800.0000 mg | ORAL_TABLET | Freq: Three times a day (TID) | ORAL | 0 refills | Status: DC

## 2018-05-14 NOTE — ED Provider Notes (Signed)
[facility]    CSN: [id number] Arrival date & time: 05/14/18  1404     History   Chief Complaint Chief Complaint  Patient presents with  . Cough    HPI Priscilla Fernandez is a 47 y.o. female.   Britanni presents with complaints of non productive cough, chills, congestion which started 6/25. No shortness of breath . Mild facial pressure. Ears have been popping. Has not worsened but not improving. No gi/gu complaints. No ill contacts. No rash. Took tussin yesterday which did not help. Hx of anemia.     ROS per HPI.      Past Medical History:  Diagnosis Date  . Anemia   . Breast screening, unspecified 2013    Patient Active Problem List   Diagnosis Date Noted  . Intramural leiomyoma of uterus 11/04/2017  . Iron deficiency anemia due to chronic blood loss 11/14/2015  . Fibrocystic breast disease 01/18/2014  . Family history of breast cancer 01/18/2014    Past Surgical History:  Procedure Laterality Date  . BREAST BIOPSY Right 12/16/2016   NEG; PASH  . BREAST BIOPSY Right 06/02/2017   NEG  . BREAST EXCISIONAL BIOPSY Bilateral 2004, 2005   multiple  . BREAST SURGERY Right 12-08-02   fibrocystic  . breast tumor removal   2005  . DILATION AND CURETTAGE OF UTERUS      OB History    Gravida  2   Para  1   Term  1   Preterm      AB  1   Living  1     SAB  1   TAB      Ectopic      Multiple      Live Births           Obstetric Comments  First pregnancy 63 Age first period 20         Home Medications    Prior to Admission medications   Medication Sig Start Date End Date Taking? Authorizing Provider  ferrous sulfate 325 (65 FE) MG EC tablet Take by mouth. 11/11/17 11/11/18 Yes [provider]  vitamin C (ASCORBIC ACID) 500 MG tablet Take by mouth.   Yes [provider]  amoxicillin-clavulanate (AUGMENTIN) 875-125 MG tablet Take 1 tablet by mouth every 12 (twelve) hours for 10 days. 05/16/18 05/26/18  Zigmund Gottron, NP  chlorpheniramine-HYDROcodone (TUSSIONEX PENNKINETIC ER) 10-8 MG/5ML SUER Take 5 mLs every 12 (twelve) hours as needed by mouth. 09/20/17   Norval Gable, MD  fluticasone (FLONASE) 50 MCG/ACT nasal spray Place 1 spray into both nostrils daily. 05/14/18   Zigmund Gottron, NP  ibuprofen (ADVIL,MOTRIN) 800 MG tablet Take 1 tablet (800 mg total) by mouth 3 (three) times daily. 05/14/18   Zigmund Gottron, NP    Family History Family History  Problem Relation Age of Onset  . Hypertension Father   . Breast cancer Maternal Grandmother 67  . Cancer Paternal Grandmother        colon or rectal  . Lung cancer Paternal Grandfather     Social History Social History   Tobacco Use  . Smoking status: Never Smoker  . Smokeless tobacco: Never Used  Substance Use Topics  . Alcohol use: Yes    Comment: rare  . Drug use: No     Allergies   Patient has no known allergies.   Review of Systems Review of Systems   Physical Exam Triage Vital Signs ED Triage Vitals  Enc Vitals Group     BP 05/14/18 1419 (!) 155/77     Pulse Rate 05/14/18 1419 77     Resp 05/14/18 1419 16     Temp 05/14/18 1419 99.4 F (37.4 C)     Temp Source 05/14/18 1419 Oral     SpO2 05/14/18 1419 98 %     Weight 05/14/18 1419 130 lb (59 kg)     Height 05/14/18 1419 5\' 3"  (1.6 m)     Head Circumference --      Peak Flow --      Pain Score 05/14/18 1418 1     Pain Loc --      Pain Edu? --      Excl. in [place]? --    No data found.  Updated Vital Signs BP (!) 155/77 (BP Location: Left Arm)   Pulse 77   Temp 99.4 F (37.4 C) (Oral)   Resp 16   Ht 5\' 3"  (1.6 m)   Wt 130 lb (59 kg)   LMP 05/08/2018   SpO2 98%   BMI 23.03 kg/m   Visual Acuity Right Eye Distance:   Left Eye Distance:   Bilateral Distance:    Right Eye Near:   Left Eye Near:    Bilateral Near:     Physical Exam  Constitutional: She is oriented to person, place, and time. She appears well-developed and well-nourished. No  distress.  HENT:  Head: Normocephalic and atraumatic.  Right Ear: Tympanic membrane, external ear and ear canal normal.  Left Ear: Tympanic membrane, external ear and ear canal normal.  Nose: Rhinorrhea present. Right sinus exhibits no frontal sinus tenderness. Left sinus exhibits no maxillary sinus tenderness and no frontal sinus tenderness.  Mouth/Throat: Uvula is midline, oropharynx is clear and moist and mucous membranes are normal. No tonsillar exudate.  Eyes: Pupils are equal, round, and reactive to light. Conjunctivae and EOM are normal.  Cardiovascular: Normal rate, regular rhythm and normal heart sounds.  Pulmonary/Chest: Effort normal and breath sounds normal.  Neurological: She is alert and oriented to person, place, and time.  Skin: Skin is warm and dry.     UC Treatments / Results  Labs (all labs ordered are listed, but only abnormal results are displayed) Labs Reviewed - No data to display  EKG None  Radiology No results found.  Procedures Procedures (including critical care time)  Medications Ordered in UC Medications - No data to display  Initial Impression / Assessment and Plan / UC Course  I have reviewed the triage vital signs and the nursing notes.  Pertinent labs & imaging results that were available during my care of the patient were reviewed by me and considered in my medical decision making (see chart for details).     Benign physical exam. Congestion. Non toxic in appearance and remains afebrile. Day 4 of illness. History and physical consistent with viral illness.  Supportive cares recommended. Course of augmentin if symptoms do not improve by Monday. Return precautions provided. Patient verbalized understanding and agreeable to plan.    Final Clinical Impressions(s) / UC Diagnoses   Final diagnoses:  Upper respiratory tract infection, unspecified type     Discharge Instructions     Push fluids to ensure adequate hydration and keep  secretions thin.  Tylenol and/or ibuprofen as needed for pain or fevers.  Daily flonase. Mucinex or tussin, nyquil at night, or other over the counter treatments as needed for symptoms.  If worsening or no improvement on  Monday may start antibiotics.  If symptoms worsen or do not improve in the next week to return to be seen or to follow up with your primary care provider.    ED Prescriptions    Medication Sig Dispense Auth. Provider   ibuprofen (ADVIL,MOTRIN) 800 MG tablet Take 1 tablet (800 mg total) by mouth 3 (three) times daily. 21 tablet Augusto Gamble B, NP   fluticasone (FLONASE) 50 MCG/ACT nasal spray Place 1 spray into both nostrils daily. 16 g Augusto Gamble B, NP   amoxicillin-clavulanate (AUGMENTIN) 875-125 MG tablet Take 1 tablet by mouth every 12 (twelve) hours for 10 days. 20 tablet Zigmund Gottron, NP     Controlled Substance Prescriptions [place] Controlled Substance Registry consulted? Not Applicable   Zigmund Gottron, NP 05/14/18 1445

## 2018-05-14 NOTE — Discharge Instructions (Signed)
Push fluids to ensure adequate hydration and keep secretions thin.  Tylenol and/or ibuprofen as needed for pain or fevers.  Daily flonase. Mucinex or tussin, nyquil at night, or other over the counter treatments as needed for symptoms.  If worsening or no improvement on Monday may start antibiotics.  If symptoms worsen or do not improve in the next week to return to be seen or to follow up with your primary care provider.

## 2018-05-14 NOTE — ED Triage Notes (Signed)
Patient c/o cough / nasal drip/ congestion x couple days.

## 2018-05-16 ENCOUNTER — Ambulatory Visit: Payer: 59 | Admitting: Obstetrics & Gynecology

## 2018-09-19 ENCOUNTER — Encounter: Payer: Self-pay | Admitting: Obstetrics & Gynecology

## 2018-09-19 ENCOUNTER — Ambulatory Visit (INDEPENDENT_AMBULATORY_CARE_PROVIDER_SITE_OTHER): Payer: 59 | Admitting: Obstetrics & Gynecology

## 2018-09-19 VITALS — BP 140/80 | Ht 63.0 in | Wt 145.0 lb

## 2018-09-19 DIAGNOSIS — R102 Pelvic and perineal pain: Secondary | ICD-10-CM

## 2018-09-19 DIAGNOSIS — N921 Excessive and frequent menstruation with irregular cycle: Secondary | ICD-10-CM | POA: Diagnosis not present

## 2018-09-19 DIAGNOSIS — D251 Intramural leiomyoma of uterus: Secondary | ICD-10-CM

## 2018-09-19 NOTE — Progress Notes (Signed)
CC: Uterine Fibroids Patient presents with a h/o uterine fibroids. Periods are usually regular every 28-30 days, lasting 5 days. Dysmenorrhea:mild, occurring throughout menses. Cyclic symptoms include none. No intermenstrual bleeding, spotting, or discharge.  BUT RECENTLY she has had 2 periods this month and some low level cramping R>L without radiation and that has worsened for the past 2 days.  No bleeding today   PMHx: She  has a past medical history of Anemia and Breast screening, unspecified (2013). Also,  has a past surgical history that includes breast tumor removal  (2005); Dilation and curettage of uterus; Breast surgery (Right, 12-08-02); Breast excisional biopsy (Bilateral, 2004, 2005); Breast biopsy (Right, 12/16/2016); and Breast biopsy (Right, 06/02/2017)., family history includes Breast cancer (age of onset: 67) in her maternal grandmother; Cancer in her paternal grandmother; Hypertension in her father; Lung cancer in her paternal grandfather.,  reports that she has never smoked. She has never used smokeless tobacco. She reports that she drinks alcohol. She reports that she does not use drugs.  She has a current medication list which includes the following prescription(s): chlorpheniramine-hydrocodone, ferrous sulfate, fluticasone, ibuprofen, and vitamin c, and the following Facility-Administered Medications: sodium chloride. Also, has No Known Allergies.  Review of Systems  Constitutional: Negative for chills, fever and malaise/fatigue.  HENT: Negative for congestion, sinus pain and sore throat.   Eyes: Negative for blurred vision and pain.  Respiratory: Negative for cough and wheezing.   Cardiovascular: Negative for chest pain and leg swelling.  Gastrointestinal: Negative for abdominal pain, constipation, diarrhea, heartburn, nausea and vomiting.  Genitourinary: Negative for dysuria, frequency, hematuria and urgency.  Musculoskeletal: Negative for back pain, joint pain, myalgias and  neck pain.  Skin: Negative for itching and rash.  Neurological: Negative for dizziness, tremors and weakness.  Endo/Heme/Allergies: Does not bruise/bleed easily.  Psychiatric/Behavioral: Negative for depression. The patient is not nervous/anxious and does not have insomnia.   All other systems reviewed and are negative.   Objective: BP 140/80   Ht 5\' 3"  (1.6 m)   Wt 145 lb (65.8 kg)   LMP 09/05/2018   BMI 25.69 kg/m  Physical Exam  Constitutional: She is oriented to person, place, and time. She appears well-developed and well-nourished. No distress.  Genitourinary: Vagina normal. Pelvic exam was performed with patient supine. There is no rash, tenderness or lesion on the right labia. There is no rash, tenderness or lesion on the left labia. No erythema or bleeding in the vagina. Right adnexum does not display mass and does not display tenderness. Left adnexum does not display mass and does not display tenderness. Cervix does not exhibit motion tenderness, discharge, polyp or nabothian cyst.   Uterus is enlarged, exhibiting a mass, irregular, mobile and midaxial.  Genitourinary Comments: Fibroid uterus 12 weeks  HENT:  Head: Normocephalic and atraumatic.  Nose: Nose normal.  Mouth/Throat: Oropharynx is clear and moist.  Abdominal: Soft. She exhibits no distension. There is no tenderness.  Musculoskeletal: Normal range of motion.  Neurological: She is alert and oriented to person, place, and time. No cranial nerve deficit.  Skin: Skin is warm and dry.  Psychiatric: She has a normal mood and affect.    ASSESSMENT/PLAN:   Problem List Items Addressed This Visit      Genitourinary   Intramural leiomyoma of uterus - Primary   Relevant Orders   US PELVIS TRANSVANGINAL NON-OB (TV ONLY)    Other Visit Diagnoses    Pelvic pain       Relevant Orders  US PELVIS TRANSVANGINAL NON-OB (TV ONLY)   Menometrorrhagia       Relevant Orders   US PELVIS TRANSVANGINAL NON-OB (TV ONLY)      Fibroid treatment such as [place], Lupron, Myomectomy, and Hysterectomy discussed in detail, with the pros and cons of each choice counseled.  No treatment as an option also discussed, as well as control of symptoms alone with hormone therapy. Information provided to the patient.  Barnett Applebaum, MD, [facility], [facility] 09/19/2018  5:03 PM

## 2018-09-20 ENCOUNTER — Ambulatory Visit (INDEPENDENT_AMBULATORY_CARE_PROVIDER_SITE_OTHER): Payer: 59 | Admitting: Obstetrics & Gynecology

## 2018-09-20 ENCOUNTER — Ambulatory Visit (INDEPENDENT_AMBULATORY_CARE_PROVIDER_SITE_OTHER): Payer: 59

## 2018-09-20 ENCOUNTER — Encounter: Payer: Self-pay | Admitting: Obstetrics & Gynecology

## 2018-09-20 VITALS — BP 148/90 | Ht 63.0 in | Wt 143.0 lb

## 2018-09-20 DIAGNOSIS — N921 Excessive and frequent menstruation with irregular cycle: Secondary | ICD-10-CM | POA: Insufficient documentation

## 2018-09-20 DIAGNOSIS — D252 Subserosal leiomyoma of uterus: Secondary | ICD-10-CM

## 2018-09-20 DIAGNOSIS — D251 Intramural leiomyoma of uterus: Secondary | ICD-10-CM | POA: Diagnosis not present

## 2018-09-20 DIAGNOSIS — R102 Pelvic and perineal pain: Secondary | ICD-10-CM | POA: Diagnosis not present

## 2018-09-20 DIAGNOSIS — D250 Submucous leiomyoma of uterus: Secondary | ICD-10-CM | POA: Diagnosis not present

## 2018-09-20 NOTE — Progress Notes (Signed)
  HPI: Pt has pain, bleeding, and pressure from known fibroids; worse bleeding now; and fatigue in dealing w them.  Ultrasound demonstrates multiple fibroids, see below These findings are Pelvis abnormal fibroids  PMHx: She  has a past medical history of Anemia and Breast screening, unspecified (2013). Also,  has a past surgical history that includes breast tumor removal  (2005); Dilation and curettage of uterus; Breast surgery (Right, 12-08-02); Breast excisional biopsy (Bilateral, 2004, 2005); Breast biopsy (Right, 12/16/2016); and Breast biopsy (Right, 06/02/2017)., family history includes Breast cancer (age of onset: 36) in her maternal grandmother; Cancer in her paternal grandmother; Hypertension in her father; Lung cancer in her paternal grandfather.,  reports that she has never smoked. She has never used smokeless tobacco. She reports that she drinks alcohol. She reports that she does not use drugs.  She has a current medication list which includes the following prescription(s): chlorpheniramine-hydrocodone, ferrous sulfate, fluticasone, ibuprofen, and vitamin c, and the following Facility-Administered Medications: sodium chloride. Also, has No Known Allergies.  Review of Systems  All other systems reviewed and are negative.   Objective: BP (!) 148/90   Ht 5\' 3"  (1.6 m)   Wt 143 lb (64.9 kg)   LMP 09/05/2018   BMI 25.33 kg/m   Physical examination Constitutional NAD, Conversant  Skin No rashes, lesions or ulceration.   Extremities: Moves all appropriately.  Normal ROM for age. No lymphadenopathy.  Neuro: Grossly intact  Psych: Oriented to PPT.  Normal mood. Normal affect.   US Pelvis Transvanginal Non-ob (tv Only)  Result Date: 09/20/2018 Patient Name: Priscilla Fernandez DOB: 10-Dec-1970 MRN: [id number] ULTRASOUND REPORT Location: [facility] Date of Service: 09/20/2018 Indications:Pelvic Pain Findings: The uterus is enlarged and anteverted measuring 11.5 x 8.2 x 9.4cm. Echo  texture is heterogenous with numerous fibroids with multiple tiny submucosal fibroids. Largest 4 measured: Fibroid 1:  2.2 x 1.7 x 1.9cm (RT, SM) Fibroid 2:  3.2 x 2.7 x 2.7cm (ML, SM) Fibroid 3:  2.9 x 2.5 x 2.2cm (LT, SS) Fibroid 4:  3.5 x 3.0 x 3.5cm (LT< SS) The Endometrium measures 9.2 mm with trace amount of fluid within. Multiple tiny submucosal fibroids abutting endometrium. Right Ovary measures 3.4 x 3.2 x 2.1 cm with dominant follicle seen. Left Ovary is not seen. Survey of the adnexa demonstrates no adnexal masses. There is no free fluid in the cul de sac. Impression: 1. Multiple fibroids throughout the uterus including multiple tiny submucosal fibroids abutting the endometrium Recommendations: 1.Clinical correlation with the patient's History and Physical Exam. Vita Barley, RDMS RVT Review of ULTRASOUND.    I have personally reviewed images and report of recent ultrasound done at [facility].    Plan of management to be discussed with patient. Barnett Applebaum, MD, [facility], [facility] 09/20/2018  3:01 PM   Assessment:  Intramural leiomyoma of uterus  Pelvic pain  Menometrorrhagia  Plan TLH possible TAH based on fibroid size and dimensions at time of surgery; alternatives discussed and she prefers this tx plan. Plan Nov 26. Recovery discussed  Barnett Applebaum, MD, [facility], [facility] 09/20/2018  3:04 PM

## 2018-09-20 NOTE — Patient Instructions (Signed)
Total Laparoscopic Hysterectomy °A total laparoscopic hysterectomy is a minimally invasive surgery to remove your uterus and cervix. This surgery is performed by making several small cuts (incisions) in your abdomen. It can also be done with a thin, lighted tube (laparoscope) inserted into two small incisions in your lower abdomen. Your fallopian tubes and ovaries can be removed (bilateral salpingo-oophorectomy) during this surgery as well. Benefits of minimally invasive surgery include: °· Less pain. °· Less risk of blood loss. °· Less risk of infection. °· Quicker return to normal activities. ° °Tell a health care provider about: °· Any allergies you have. °· All medicines you are taking, including vitamins, herbs, eye drops, creams, and over-the-counter medicines. °· Any problems you or family members have had with anesthetic medicines. °· Any blood disorders you have. °· Any surgeries you have had. °· Any medical conditions you have. °What are the risks? °Generally, this is a safe procedure. However, as with any procedure, complications can occur. Possible complications include: °· Bleeding. °· Blood clots in the legs or lung. °· Infection. °· Injury to surrounding organs. °· Problems with anesthesia. °· Early menopause symptoms (hot flashes, night sweats, insomnia). °· Risk of conversion to an open abdominal incision. ° °What happens before the procedure? °· Ask your health care provider about changing or stopping your regular medicines. °· Do not take aspirin or blood thinners (anticoagulants) for 1 week before the surgery or as told by your health care provider. °· Do not eat or drink anything for 8 hours before the surgery or as told by your health care provider. °· Quit smoking if you smoke. °· Arrange for a ride home after surgery and for someone to help you at home during recovery. °What happens during the procedure? °· You will be given antibiotic medicine. °· An IV tube will be placed in your arm. You  will be given medicine to make you sleep (general anesthetic). °· A gas (carbon dioxide) will be used to inflate your abdomen. This will allow your surgeon to look inside your abdomen, perform your surgery, and treat any other problems found if necessary. °· Three or four small incisions (often less than 1/2 inch) will be made in your abdomen. One of these incisions will be made in the area of your belly button (navel). The laparoscope will be inserted into the incision. Your surgeon will look through the laparoscope while doing your procedure. °· Other surgical instruments will be inserted through the other incisions. °· Your uterus may be removed through your vagina or cut into small pieces and removed through the small incisions. °· Your incisions will be closed. °What happens after the procedure? °· The gas will be released from inside your abdomen. °· You will be taken to the recovery area where a nurse will watch and check your progress. Once you are awake, stable, and taking fluids well, without other problems, you will return to your room or be allowed to go home. °· There is usually minimal discomfort following the surgery because the incisions are so small. °· You will be given pain medicine while you are in the hospital and for when you go home. °This information is not intended to replace advice given to you by your health care provider. Make sure you discuss any questions you have with your health care provider. °Document Released: 08/30/2007 Document Revised: 04/09/2016 Document Reviewed: 05/23/2013 °Elsevier Interactive Patient Education © 2017 [employer] ° °

## 2018-09-23 ENCOUNTER — Telehealth: Payer: Self-pay | Admitting: Obstetrics & Gynecology

## 2018-09-23 ENCOUNTER — Other Ambulatory Visit: Payer: Self-pay | Admitting: Obstetrics & Gynecology

## 2018-09-23 DIAGNOSIS — Z1231 Encounter for screening mammogram for malignant neoplasm of breast: Secondary | ICD-10-CM

## 2018-09-23 NOTE — Telephone Encounter (Signed)
Patient is aware of H&P at [facility] on 10/03/18 @ 4:10pm w/ Dr Kenton Kingfisher, [place] phone interview to be scheduled, and OR on 10/11/18. Patient is aware she may receive calls from the [place] and [facility]. Patient confirmed [place], and no secondary insurance. Ext given.

## 2018-09-23 NOTE — Telephone Encounter (Signed)
-----   Message from Gae Dry, MD sent at 09/20/2018  3:02 PM EST ----- Regarding: SURGERY Surgery Booking Request Patient Full Name:  Priscilla Fernandez  MRN: [id number]  DOB: Aug 23, 1971  Surgeon: Hoyt Koch, MD  Requested Surgery Date and Time: 10/11/18 Primary Diagnosis AND Code: Menometrorrhagia, Fibroids, Pelvic Pain Secondary Diagnosis and Code:  Surgical Procedure: TLH/BS, possible laparotomy L&D Notification: No Admission Status: same day surgery, possible overnight observation Length of Surgery: 1.5 hour Special Case Needs: no H&P: yes (date) Phone Interview???: yes Interpreter: Language:  Medical Clearance: no Special Scheduling Instructions: no

## 2018-10-03 ENCOUNTER — Encounter: Payer: 59 | Admitting: Obstetrics & Gynecology

## 2018-10-04 ENCOUNTER — Encounter: Payer: Self-pay | Admitting: Obstetrics & Gynecology

## 2018-10-04 ENCOUNTER — Ambulatory Visit (INDEPENDENT_AMBULATORY_CARE_PROVIDER_SITE_OTHER): Payer: 59 | Admitting: Obstetrics & Gynecology

## 2018-10-04 ENCOUNTER — Encounter: Payer: 59 | Admitting: Obstetrics & Gynecology

## 2018-10-04 ENCOUNTER — Inpatient Hospital Stay: Admission: RE | Admit: 2018-10-04 | Payer: Self-pay | Source: Ambulatory Visit

## 2018-10-04 VITALS — BP 140/80 | Wt 145.0 lb

## 2018-10-04 DIAGNOSIS — N921 Excessive and frequent menstruation with irregular cycle: Secondary | ICD-10-CM | POA: Diagnosis not present

## 2018-10-04 DIAGNOSIS — D251 Intramural leiomyoma of uterus: Secondary | ICD-10-CM

## 2018-10-04 DIAGNOSIS — R102 Pelvic and perineal pain: Secondary | ICD-10-CM

## 2018-10-04 NOTE — H&P (View-Only) (Signed)
PRE-OPERATIVE HISTORY AND PHYSICAL EXAM  HPI:  Priscilla Fernandez is a 47 y.o. G2P1011 Patient's last menstrual period was 09/05/2018.; she is being admitted for surgery related to fibroids.  Pt has pain, bleeding, and pressure from known fibroids; worse bleeding now; and fatigue in dealing w them.  By ultrasound, the uterus is enlarged and anteverted measuring 11.5 x 8.2 x 9.4cm. Echo texture is heterogenous with numerous fibroids with multiple tiny submucosal fibroids. Largest 4 measured: Fibroid 1:  2.2 x 1.7 x 1.9cm (RT, SM) Fibroid 2:  3.2 x 2.7 x 2.7cm (ML, SM) Fibroid 3:  2.9 x 2.5 x 2.2cm (LT, SS) Fibroid 4:  3.5 x 3.0 x 3.5cm (LT< SS)  PMHx: Past Medical History:  Diagnosis Date  . Anemia   . Breast screening, unspecified 2013   Past Surgical History:  Procedure Laterality Date  . BREAST BIOPSY Right 12/16/2016   NEG; PASH  . BREAST BIOPSY Right 06/02/2017   NEG  . BREAST EXCISIONAL BIOPSY Bilateral 2004, 2005   multiple  . BREAST SURGERY Right 12-08-02   fibrocystic  . breast tumor removal   2005  . DILATION AND CURETTAGE OF UTERUS     Family History  Problem Relation Age of Onset  . Hypertension Father   . Breast cancer Maternal Grandmother 42  . Cancer Paternal Grandmother        colon or rectal  . Lung cancer Paternal Grandfather    Social History   Tobacco Use  . Smoking status: Never Smoker  . Smokeless tobacco: Never Used  Substance Use Topics  . Alcohol use: Yes    Comment: rare  . Drug use: No    Current Outpatient Medications:  .  chlorpheniramine-HYDROcodone (TUSSIONEX PENNKINETIC ER) 10-8 MG/5ML SUER, Take 5 mLs every 12 (twelve) hours as needed by mouth., Disp: 120 mL, Rfl: 0 .  ferrous sulfate 325 (65 FE) MG EC tablet, Take 325 mg by mouth daily with breakfast. , Disp: , Rfl:  .  fluticasone (FLONASE) 50 MCG/ACT nasal spray, Place 1 spray into both nostrils daily., Disp: 16 g, Rfl: 2 .  ibuprofen (ADVIL,MOTRIN) 800 MG tablet, Take 1 tablet (800  mg total) by mouth 3 (three) times daily., Disp: 21 tablet, Rfl: 0 .  vitamin C (ASCORBIC ACID) 500 MG tablet, Take 500 mg by mouth daily. , Disp: , Rfl:  No current facility-administered medications for this visit.   Facility-Administered Medications Ordered in Other Visits:  .  0.9 %  sodium chloride infusion, , Intravenous, Continuous, Berenzon, Dmitriy, MD Allergies: Patient has no known allergies.  Review of Systems  Constitutional: Negative for chills, fever and malaise/fatigue.  HENT: Negative for congestion, sinus pain and sore throat.   Eyes: Negative for blurred vision and pain.  Respiratory: Negative for cough and wheezing.   Cardiovascular: Negative for chest pain and leg swelling.  Gastrointestinal: Negative for abdominal pain, constipation, diarrhea, heartburn, nausea and vomiting.  Genitourinary: Negative for dysuria, frequency, hematuria and urgency.  Musculoskeletal: Negative for back pain, joint pain, myalgias and neck pain.  Skin: Negative for itching and rash.  Neurological: Negative for dizziness, tremors and weakness.  Endo/Heme/Allergies: Does not bruise/bleed easily.  Psychiatric/Behavioral: Negative for depression. The patient is not nervous/anxious and does not have insomnia.     Objective: BP 140/80   Wt 145 lb (65.8 kg)   LMP 09/05/2018   BMI 25.69 kg/m   Filed Weights   10/04/18 1532  Weight: 145 lb (65.8 kg)  Physical Exam  Constitutional: She is oriented to person, place, and time. She appears well-developed and well-nourished. No distress.  HENT:  Head: Normocephalic and atraumatic. Head is without laceration.  Right Ear: Hearing normal.  Left Ear: Hearing normal.  Nose: No epistaxis.  No foreign bodies.  Mouth/Throat: Uvula is midline, oropharynx is clear and moist and mucous membranes are normal.  Eyes: Pupils are equal, round, and reactive to light.  Neck: Normal range of motion. Neck supple. No thyromegaly present.  Cardiovascular:  Normal rate and regular rhythm. Exam reveals no gallop and no friction rub.  No murmur heard. Pulmonary/Chest: Effort normal and breath sounds normal. No respiratory distress. She has no wheezes. Right breast exhibits no mass, no skin change and no tenderness. Left breast exhibits no mass, no skin change and no tenderness.  Abdominal: Soft. Bowel sounds are normal. She exhibits no distension. There is no tenderness. There is no rebound.  Musculoskeletal: Normal range of motion.  Neurological: She is alert and oriented to person, place, and time. No cranial nerve deficit.  Skin: Skin is warm and dry.  Psychiatric: She has a normal mood and affect. Judgment normal.  Vitals reviewed.   Assessment: 1. Intramural leiomyoma of uterus   2. Menometrorrhagia   3. Pelvic pain   Plans TLH BS with preservation of ovaries.  I have had a careful discussion with this patient about all the options available and the risk/benefits of each. I have fully informed this patient that surgery may subject her to a variety of discomforts and risks: She understands that most patients have surgery with little difficulty, but problems can happen ranging from minor to fatal. These include nausea, vomiting, pain, bleeding, infection, poor healing, hernia, or formation of adhesions. Unexpected reactions may occur from any drug or anesthetic given. Unintended injury may occur to other pelvic or abdominal structures such as Fallopian tubes, ovaries, bladder, ureter (tube from kidney to bladder), or bowel. Nerves going from the pelvis to the legs may be injured. Any such injury may require immediate or later additional surgery to correct the problem. Excessive blood loss requiring transfusion is very unlikely but possible. Dangerous blood clots may form in the legs or lungs. Physical and sexual activity will be restricted in varying degrees for an indeterminate period of time but most often 2-6 weeks.  Finally, she understands that  it is impossible to list every possible undesirable effect and that the condition for which surgery is done is not always cured or significantly improved, and in rare cases may be even worse.Ample time was given to answer all questions.  Barnett Applebaum, MD, [facility], [facility] 10/04/2018  4:17 PM

## 2018-10-04 NOTE — Progress Notes (Signed)
PRE-OPERATIVE HISTORY AND PHYSICAL EXAM  HPI:  Priscilla Fernandez is a 47 y.o. G2P1011 Patient's last menstrual period was 09/05/2018.; she is being admitted for surgery related to fibroids.  Pt has pain, bleeding, and pressure from known fibroids; worse bleeding now; and fatigue in dealing w them.  By ultrasound, the uterus is enlarged and anteverted measuring 11.5 x 8.2 x 9.4cm. Echo texture is heterogenous with numerous fibroids with multiple tiny submucosal fibroids. Largest 4 measured: Fibroid 1:  2.2 x 1.7 x 1.9cm (RT, SM) Fibroid 2:  3.2 x 2.7 x 2.7cm (ML, SM) Fibroid 3:  2.9 x 2.5 x 2.2cm (LT, SS) Fibroid 4:  3.5 x 3.0 x 3.5cm (LT< SS)  PMHx: Past Medical History:  Diagnosis Date  . Anemia   . Breast screening, unspecified 2013   Past Surgical History:  Procedure Laterality Date  . BREAST BIOPSY Right 12/16/2016   NEG; PASH  . BREAST BIOPSY Right 06/02/2017   NEG  . BREAST EXCISIONAL BIOPSY Bilateral 2004, 2005   multiple  . BREAST SURGERY Right 12-08-02   fibrocystic  . breast tumor removal   2005  . DILATION AND CURETTAGE OF UTERUS     Family History  Problem Relation Age of Onset  . Hypertension Father   . Breast cancer Maternal Grandmother 23  . Cancer Paternal Grandmother        colon or rectal  . Lung cancer Paternal Grandfather    Social History   Tobacco Use  . Smoking status: Never Smoker  . Smokeless tobacco: Never Used  Substance Use Topics  . Alcohol use: Yes    Comment: rare  . Drug use: No    Current Outpatient Medications:  .  chlorpheniramine-HYDROcodone (TUSSIONEX PENNKINETIC ER) 10-8 MG/5ML SUER, Take 5 mLs every 12 (twelve) hours as needed by mouth., Disp: 120 mL, Rfl: 0 .  ferrous sulfate 325 (65 FE) MG EC tablet, Take 325 mg by mouth daily with breakfast. , Disp: , Rfl:  .  fluticasone (FLONASE) 50 MCG/ACT nasal spray, Place 1 spray into both nostrils daily., Disp: 16 g, Rfl: 2 .  ibuprofen (ADVIL,MOTRIN) 800 MG tablet, Take 1 tablet (800  mg total) by mouth 3 (three) times daily., Disp: 21 tablet, Rfl: 0 .  vitamin C (ASCORBIC ACID) 500 MG tablet, Take 500 mg by mouth daily. , Disp: , Rfl:  No current facility-administered medications for this visit.   Facility-Administered Medications Ordered in Other Visits:  .  0.9 %  sodium chloride infusion, , Intravenous, Continuous, Berenzon, Dmitriy, MD Allergies: Patient has no known allergies.  Review of Systems  Constitutional: Negative for chills, fever and malaise/fatigue.  HENT: Negative for congestion, sinus pain and sore throat.   Eyes: Negative for blurred vision and pain.  Respiratory: Negative for cough and wheezing.   Cardiovascular: Negative for chest pain and leg swelling.  Gastrointestinal: Negative for abdominal pain, constipation, diarrhea, heartburn, nausea and vomiting.  Genitourinary: Negative for dysuria, frequency, hematuria and urgency.  Musculoskeletal: Negative for back pain, joint pain, myalgias and neck pain.  Skin: Negative for itching and rash.  Neurological: Negative for dizziness, tremors and weakness.  Endo/Heme/Allergies: Does not bruise/bleed easily.  Psychiatric/Behavioral: Negative for depression. The patient is not nervous/anxious and does not have insomnia.     Objective: BP 140/80   Wt 145 lb (65.8 kg)   LMP 09/05/2018   BMI 25.69 kg/m   Filed Weights   10/04/18 1532  Weight: 145 lb (65.8 kg)  Physical Exam  Constitutional: She is oriented to person, place, and time. She appears well-developed and well-nourished. No distress.  HENT:  Head: Normocephalic and atraumatic. Head is without laceration.  Right Ear: Hearing normal.  Left Ear: Hearing normal.  Nose: No epistaxis.  No foreign bodies.  Mouth/Throat: Uvula is midline, oropharynx is clear and moist and mucous membranes are normal.  Eyes: Pupils are equal, round, and reactive to light.  Neck: Normal range of motion. Neck supple. No thyromegaly present.  Cardiovascular:  Normal rate and regular rhythm. Exam reveals no gallop and no friction rub.  No murmur heard. Pulmonary/Chest: Effort normal and breath sounds normal. No respiratory distress. She has no wheezes. Right breast exhibits no mass, no skin change and no tenderness. Left breast exhibits no mass, no skin change and no tenderness.  Abdominal: Soft. Bowel sounds are normal. She exhibits no distension. There is no tenderness. There is no rebound.  Musculoskeletal: Normal range of motion.  Neurological: She is alert and oriented to person, place, and time. No cranial nerve deficit.  Skin: Skin is warm and dry.  Psychiatric: She has a normal mood and affect. Judgment normal.  Vitals reviewed.   Assessment: 1. Intramural leiomyoma of uterus   2. Menometrorrhagia   3. Pelvic pain   Plans TLH BS with preservation of ovaries.  I have had a careful discussion with this patient about all the options available and the risk/benefits of each. I have fully informed this patient that surgery may subject her to a variety of discomforts and risks: She understands that most patients have surgery with little difficulty, but problems can happen ranging from minor to fatal. These include nausea, vomiting, pain, bleeding, infection, poor healing, hernia, or formation of adhesions. Unexpected reactions may occur from any drug or anesthetic given. Unintended injury may occur to other pelvic or abdominal structures such as Fallopian tubes, ovaries, bladder, ureter (tube from kidney to bladder), or bowel. Nerves going from the pelvis to the legs may be injured. Any such injury may require immediate or later additional surgery to correct the problem. Excessive blood loss requiring transfusion is very unlikely but possible. Dangerous blood clots may form in the legs or lungs. Physical and sexual activity will be restricted in varying degrees for an indeterminate period of time but most often 2-6 weeks.  Finally, she understands that  it is impossible to list every possible undesirable effect and that the condition for which surgery is done is not always cured or significantly improved, and in rare cases may be even worse.Ample time was given to answer all questions.  Barnett Applebaum, MD, [facility], [facility] 10/04/2018  4:17 PM

## 2018-10-04 NOTE — Patient Instructions (Signed)

## 2018-10-05 ENCOUNTER — Other Ambulatory Visit: Payer: Self-pay

## 2018-10-05 ENCOUNTER — Encounter
Admission: RE | Admit: 2018-10-05 | Discharge: 2018-10-05 | Disposition: A | Discharge status: Home / Self Care | Payer: 59 | Source: Ambulatory Visit | Attending: Obstetrics & Gynecology | Admitting: Obstetrics & Gynecology

## 2018-10-05 HISTORY — DX: Cardiac murmur, unspecified: R01.1

## 2018-10-05 NOTE — Patient Instructions (Signed)
Your procedure is scheduled on: 10-11-18 TUESDAY Report to Same Day Surgery 2nd floor medical mall [facility] Entrance-take elevator on left to 2nd floor.  Check in with surgery information desk.) To find out your arrival time please call [phone] between 1PM - 3PM on 10-10-18 MONDAY  Remember: Instructions that are not followed completely may result in serious medical risk, up to and including death, or upon the discretion of your surgeon and anesthesiologist your surgery may need to be rescheduled.    _x___ 1. Do not eat food after midnight the night before your procedure. NO GUM OR CANDY AFTER MIDNIGHT.  You may drink clear liquids up to 2 hours before you are scheduled to arrive at the hospital for your procedure.  Do not drink clear liquids within 2 hours of your scheduled arrival to the hospital.  Clear liquids include  --Water or Apple juice without pulp  --Clear carbohydrate beverage such as ClearFast or Gatorade  --Black Coffee or Clear Tea (No milk, no creamers, do not add anything to the coffee or Tea   ____Ensure clear carbohydrate drink on the way to the hospital for bariatric patients  ____Ensure clear carbohydrate drink 3 hours before surgery for Dr Dwyane Luo patients if physician instructed.    __x__ 2. No Alcohol for 24 hours before or after surgery.   __x__3. No Smoking or e-cigarettes for 24 prior to surgery.  Do not use any chewable tobacco products for at least 6 hour prior to surgery   ____  4. Bring all medications with you on the day of surgery if instructed.    __x__ 5. Notify your doctor if there is any change in your medical condition     (cold, fever, infections).    x___6. On the morning of surgery brush your teeth with toothpaste and water.  You may rinse your mouth with mouth wash if you wish.  Do not swallow any toothpaste or mouthwash.   Do not wear jewelry, make-up, hairpins, clips or nail polish.  Do not wear lotions, powders, or perfumes.  You may wear deodorant.  Do not shave 48 hours prior to surgery. Men may shave face and neck.  Do not bring valuables to the hospital.    [facility] is not responsible for any belongings or valuables.               Contacts, dentures or bridgework may not be worn into surgery.  Leave your suitcase in the car. After surgery it may be brought to your room.  For patients admitted to the hospital, discharge time is determined by your treatment team.  _  Patients discharged the day of surgery will not be allowed to drive home.  You will need someone to drive you home and stay with you the night of your procedure.    Please read over the following fact sheets that you were given:   [facility] Preparing for Surgery   ____ Take anti-hypertensive listed below, cardiac, seizure, asthma, anti-reflux and psychiatric medicines. These include:  1. NONE  2.  3.  4.  5.  6.  ____Fleets enema or Magnesium Citrate as directed.   _x___ Use CHG Soap or sage wipes as directed on instruction sheet   ____ Use inhalers on the day of surgery and bring to hospital day of surgery  ____ Stop Metformin and Janumet 2 days prior to surgery.    ____ Take 1/2 of usual insulin dose the night before surgery and none on  the morning surgery.   ____ Follow recommendations from Cardiologist, Pulmonologist or PCP regarding stopping Aspirin, Coumadin, Plavix ,Eliquis, Effient, or Pradaxa, and Pletal.  X____Stop Anti-inflammatories such as Advil, Aleve, Ibuprofen, Motrin, Naproxen, Naprosyn, Goodies powders or aspirin products NOW- OK to take Tylenol    _x___ Stop supplements until after surgery-STOP VITAMIN C NOW-MAY RESUME AFTER SURGERY   ____ Bring C-Pap to the hospital.

## 2018-10-05 NOTE — Pre-Procedure Instructions (Signed)
Echocardiogram W Colorflow Spectral Doppler12/27/2018 [facility] Component Name Value Ref Range  LV Diastolic Diameter PLAX 5.1 cm  LV Systolic Diameter PLAX 2.9 cm  IVS Diastolic Thickness 1.0 cm  LVPW Diastolic Thickness 0.9 cm  LA Systolic Diameter LX 4.0 cm  LA Area 4C View 20.9 cm2  LA Area 2C View 18.3 cm2  AV Peak Velocity 1.9 m/s  AV Peak Gradient 14.0   Mitral E Point Velocity 0.0 m/s  Mitral A Point Velocity 0.0 m/s  Mitral E to A Ratio 1.4   PV Peak Velocity 1.6 m/s  PV peak gradient 10.00 mmHg  Other Result Information  This result has an attachment that is not available.  Result Narrative   Normal left ventricular systolic function, ejection fraction > 82%  Diastolic dysfunction - grade II (elevated filling pressures)  Dilated left atrium - mild  Normal right ventricular systolic function  Dilated right atrium - mild  No significant valvular abnormalities  Technically difficult study due to chest wall/lung interference  Status Results Details   Encounter Summary

## 2018-10-10 ENCOUNTER — Encounter
Admission: RE | Admit: 2018-10-10 | Discharge: 2018-10-10 | Disposition: A | Discharge status: Home / Self Care | Payer: 59 | Source: Ambulatory Visit | Attending: Obstetrics & Gynecology | Admitting: Obstetrics & Gynecology

## 2018-10-10 DIAGNOSIS — N838 Other noninflammatory disorders of ovary, fallopian tube and broad ligament: Secondary | ICD-10-CM | POA: Diagnosis not present

## 2018-10-10 DIAGNOSIS — Z01812 Encounter for preprocedural laboratory examination: Secondary | ICD-10-CM

## 2018-10-10 DIAGNOSIS — Z79899 Other long term (current) drug therapy: Secondary | ICD-10-CM | POA: Diagnosis not present

## 2018-10-10 DIAGNOSIS — D252 Subserosal leiomyoma of uterus: Secondary | ICD-10-CM | POA: Diagnosis not present

## 2018-10-10 DIAGNOSIS — N800 Endometriosis of uterus: Secondary | ICD-10-CM | POA: Diagnosis not present

## 2018-10-10 DIAGNOSIS — D250 Submucous leiomyoma of uterus: Secondary | ICD-10-CM | POA: Diagnosis not present

## 2018-10-10 DIAGNOSIS — D251 Intramural leiomyoma of uterus: Secondary | ICD-10-CM | POA: Diagnosis not present

## 2018-10-10 DIAGNOSIS — Z791 Long term (current) use of non-steroidal anti-inflammatories (NSAID): Secondary | ICD-10-CM | POA: Diagnosis not present

## 2018-10-10 DIAGNOSIS — Z7951 Long term (current) use of inhaled steroids: Secondary | ICD-10-CM | POA: Diagnosis not present

## 2018-10-10 DIAGNOSIS — R102 Pelvic and perineal pain: Secondary | ICD-10-CM | POA: Diagnosis not present

## 2018-10-10 DIAGNOSIS — N921 Excessive and frequent menstruation with irregular cycle: Secondary | ICD-10-CM | POA: Diagnosis not present

## 2018-10-10 LAB — CBC
HCT: 28.8 % — ABNORMAL LOW (ref 36.0–46.0)
Hemoglobin: 8.3 g/dL — ABNORMAL LOW (ref 12.0–15.0)
MCH: 22 pg — ABNORMAL LOW (ref 26.0–34.0)
MCHC: 28.8 g/dL — AB (ref 30.0–36.0)
MCV: 76.2 fL — AB (ref 80.0–100.0)
NRBC: 0 % (ref 0.0–0.2)
Platelets: 273 10*3/uL (ref 150–400)
RBC: 3.78 MIL/uL — ABNORMAL LOW (ref 3.87–5.11)
RDW: 17.2 % — AB (ref 11.5–15.5)
WBC: 3.2 10*3/uL — AB (ref 4.0–10.5)

## 2018-10-10 LAB — TYPE AND SCREEN
ABO/RH(D): O POS
ANTIBODY SCREEN: NEGATIVE

## 2018-10-10 MED ORDER — SODIUM CHLORIDE 0.9 % IV SOLN
2.0000 g | INTRAVENOUS | Status: AC
  Administered 2018-10-11: 2 g via INTRAVENOUS

## 2018-10-11 ENCOUNTER — Ambulatory Visit
Admission: RE | Admit: 2018-10-11 | Discharge: 2018-10-11 | Disposition: A | Discharge status: Home / Self Care | Payer: 59 | Source: Ambulatory Visit | Attending: Obstetrics & Gynecology | Admitting: Obstetrics & Gynecology

## 2018-10-11 ENCOUNTER — Other Ambulatory Visit: Payer: Self-pay

## 2018-10-11 ENCOUNTER — Ambulatory Visit: Payer: 59 | Admitting: Certified Registered"

## 2018-10-11 ENCOUNTER — Encounter: Payer: Self-pay | Admitting: *Deleted

## 2018-10-11 ENCOUNTER — Encounter
Admission: RE | Disposition: A | Discharge status: Home / Self Care | Payer: Self-pay | Source: Ambulatory Visit | Attending: Obstetrics & Gynecology

## 2018-10-11 DIAGNOSIS — N921 Excessive and frequent menstruation with irregular cycle: Secondary | ICD-10-CM | POA: Diagnosis not present

## 2018-10-11 DIAGNOSIS — Z791 Long term (current) use of non-steroidal anti-inflammatories (NSAID): Secondary | ICD-10-CM | POA: Insufficient documentation

## 2018-10-11 DIAGNOSIS — N800 Endometriosis of uterus: Secondary | ICD-10-CM | POA: Diagnosis not present

## 2018-10-11 DIAGNOSIS — R102 Pelvic and perineal pain: Secondary | ICD-10-CM | POA: Insufficient documentation

## 2018-10-11 DIAGNOSIS — N838 Other noninflammatory disorders of ovary, fallopian tube and broad ligament: Secondary | ICD-10-CM | POA: Insufficient documentation

## 2018-10-11 DIAGNOSIS — D251 Intramural leiomyoma of uterus: Secondary | ICD-10-CM | POA: Diagnosis not present

## 2018-10-11 DIAGNOSIS — D259 Leiomyoma of uterus, unspecified: Secondary | ICD-10-CM | POA: Diagnosis not present

## 2018-10-11 DIAGNOSIS — Z79899 Other long term (current) drug therapy: Secondary | ICD-10-CM | POA: Diagnosis not present

## 2018-10-11 DIAGNOSIS — Z7951 Long term (current) use of inhaled steroids: Secondary | ICD-10-CM | POA: Insufficient documentation

## 2018-10-11 DIAGNOSIS — D250 Submucous leiomyoma of uterus: Secondary | ICD-10-CM | POA: Insufficient documentation

## 2018-10-11 DIAGNOSIS — D252 Subserosal leiomyoma of uterus: Secondary | ICD-10-CM | POA: Diagnosis not present

## 2018-10-11 HISTORY — PX: LAPAROSCOPIC HYSTERECTOMY: SHX1926

## 2018-10-11 HISTORY — PX: CYSTOSCOPY: SHX5120

## 2018-10-11 SURGERY — HYSTERECTOMY, TOTAL, LAPAROSCOPIC
Anesthesia: General

## 2018-10-11 MED ORDER — LIDOCAINE HCL (CARDIAC) PF 100 MG/5ML IV SOSY
PREFILLED_SYRINGE | INTRAVENOUS | Status: DC | PRN
  Administered 2018-10-11: 60 mg via INTRAVENOUS

## 2018-10-11 MED ORDER — SODIUM CHLORIDE 0.9 % IV SOLN
INTRAVENOUS | Status: AC
  Filled 2018-10-11: qty 2

## 2018-10-11 MED ORDER — MIDAZOLAM HCL 2 MG/2ML IJ SOLN
INTRAMUSCULAR | Status: AC
  Filled 2018-10-11: qty 2

## 2018-10-11 MED ORDER — ACETAMINOPHEN 10 MG/ML IV SOLN
INTRAVENOUS | Status: DC | PRN
  Administered 2018-10-11: 1000 mg via INTRAVENOUS

## 2018-10-11 MED ORDER — LACTATED RINGERS IV SOLN: INTRAVENOUS | Status: DC

## 2018-10-11 MED ORDER — ACETAMINOPHEN 650 MG RE SUPP
650.0000 mg | RECTAL | Status: DC | PRN
  Filled 2018-10-11: qty 1

## 2018-10-11 MED ORDER — BUPIVACAINE HCL (PF) 0.5 % IJ SOLN
INTRAMUSCULAR | Status: DC | PRN
  Administered 2018-10-11: 14 mL

## 2018-10-11 MED ORDER — SUGAMMADEX SODIUM 200 MG/2ML IV SOLN
INTRAVENOUS | Status: AC
  Filled 2018-10-11: qty 2

## 2018-10-11 MED ORDER — DEXAMETHASONE SODIUM PHOSPHATE 10 MG/ML IJ SOLN
INTRAMUSCULAR | Status: AC
  Filled 2018-10-11: qty 1

## 2018-10-11 MED ORDER — HYDROMORPHONE HCL 1 MG/ML IJ SOLN: 0.2500 mg | INTRAMUSCULAR | Status: DC | PRN

## 2018-10-11 MED ORDER — OXYCODONE-ACETAMINOPHEN 5-325 MG PO TABS: 1.0000 | ORAL_TABLET | ORAL | 0 refills | Status: DC | PRN

## 2018-10-11 MED ORDER — KETOROLAC TROMETHAMINE 30 MG/ML IJ SOLN
INTRAMUSCULAR | Status: DC | PRN
  Administered 2018-10-11: 30 mg via INTRAVENOUS

## 2018-10-11 MED ORDER — SCOPOLAMINE 1 MG/3DAYS TD PT72
MEDICATED_PATCH | TRANSDERMAL | Status: DC | PRN
  Administered 2018-10-11: 1 via TRANSDERMAL

## 2018-10-11 MED ORDER — ROCURONIUM BROMIDE 100 MG/10ML IV SOLN
INTRAVENOUS | Status: DC | PRN
  Administered 2018-10-11: 40 mg via INTRAVENOUS

## 2018-10-11 MED ORDER — PROPOFOL 500 MG/50ML IV EMUL
INTRAVENOUS | Status: AC
  Filled 2018-10-11: qty 50

## 2018-10-11 MED ORDER — FENTANYL CITRATE (PF) 100 MCG/2ML IJ SOLN
INTRAMUSCULAR | Status: DC | PRN
  Administered 2018-10-11: 75 ug via INTRAVENOUS
  Administered 2018-10-11: 50 ug via INTRAVENOUS
  Administered 2018-10-11: 25 ug via INTRAVENOUS
  Administered 2018-10-11: 50 ug via INTRAVENOUS

## 2018-10-11 MED ORDER — MIDAZOLAM HCL 2 MG/2ML IJ SOLN
INTRAMUSCULAR | Status: DC | PRN
  Administered 2018-10-11: 2 mg via INTRAVENOUS

## 2018-10-11 MED ORDER — BUPIVACAINE HCL (PF) 0.5 % IJ SOLN
INTRAMUSCULAR | Status: AC
  Filled 2018-10-11: qty 30

## 2018-10-11 MED ORDER — LACTATED RINGERS IV SOLN
INTRAVENOUS | Status: DC
  Administered 2018-10-11: 10:00:00 via INTRAVENOUS

## 2018-10-11 MED ORDER — FAMOTIDINE 20 MG PO TABS
ORAL_TABLET | ORAL | Status: AC
  Filled 2018-10-11: qty 1

## 2018-10-11 MED ORDER — PROMETHAZINE HCL 25 MG/ML IJ SOLN: 12.5000 mg | INTRAMUSCULAR | Status: DC | PRN

## 2018-10-11 MED ORDER — SCOPOLAMINE 1 MG/3DAYS TD PT72
MEDICATED_PATCH | TRANSDERMAL | Status: AC
  Filled 2018-10-11: qty 1

## 2018-10-11 MED ORDER — ONDANSETRON HCL 4 MG/2ML IJ SOLN
INTRAMUSCULAR | Status: AC
  Filled 2018-10-11: qty 2

## 2018-10-11 MED ORDER — PROPOFOL 500 MG/50ML IV EMUL
INTRAVENOUS | Status: DC | PRN
  Administered 2018-10-11: 50 ug/kg/min via INTRAVENOUS

## 2018-10-11 MED ORDER — KETOROLAC TROMETHAMINE 30 MG/ML IJ SOLN: 30.0000 mg | Freq: Four times a day (QID) | INTRAMUSCULAR | Status: DC

## 2018-10-11 MED ORDER — PROPOFOL 10 MG/ML IV BOLUS
INTRAVENOUS | Status: DC | PRN
  Administered 2018-10-11: 120 mg via INTRAVENOUS

## 2018-10-11 MED ORDER — FENTANYL CITRATE (PF) 250 MCG/5ML IJ SOLN
INTRAMUSCULAR | Status: AC
  Filled 2018-10-11: qty 5

## 2018-10-11 MED ORDER — EPHEDRINE SULFATE 50 MG/ML IJ SOLN
INTRAMUSCULAR | Status: DC | PRN
  Administered 2018-10-11 (×2): 5 mg via INTRAVENOUS

## 2018-10-11 MED ORDER — FAMOTIDINE 20 MG PO TABS
20.0000 mg | ORAL_TABLET | Freq: Once | ORAL | Status: AC
  Administered 2018-10-11: 20 mg via ORAL

## 2018-10-11 MED ORDER — SUGAMMADEX SODIUM 200 MG/2ML IV SOLN
INTRAVENOUS | Status: DC | PRN
  Administered 2018-10-11: 131.6 mg via INTRAVENOUS

## 2018-10-11 MED ORDER — MORPHINE SULFATE (PF) 4 MG/ML IV SOLN: 1.0000 mg | INTRAVENOUS | Status: DC | PRN

## 2018-10-11 MED ORDER — LIDOCAINE HCL (PF) 2 % IJ SOLN
INTRAMUSCULAR | Status: AC
  Filled 2018-10-11: qty 10

## 2018-10-11 MED ORDER — OXYCODONE-ACETAMINOPHEN 5-325 MG PO TABS: 1.0000 | ORAL_TABLET | ORAL | Status: DC | PRN

## 2018-10-11 MED ORDER — KETOROLAC TROMETHAMINE 30 MG/ML IJ SOLN
INTRAMUSCULAR | Status: AC
  Filled 2018-10-11: qty 1

## 2018-10-11 MED ORDER — ROCURONIUM BROMIDE 50 MG/5ML IV SOLN
INTRAVENOUS | Status: AC
  Filled 2018-10-11: qty 1

## 2018-10-11 MED ORDER — ONDANSETRON HCL 4 MG/2ML IJ SOLN
INTRAMUSCULAR | Status: DC | PRN
  Administered 2018-10-11: 4 mg via INTRAVENOUS

## 2018-10-11 MED ORDER — PHENYLEPHRINE HCL 10 MG/ML IJ SOLN
INTRAMUSCULAR | Status: AC
  Filled 2018-10-11: qty 1

## 2018-10-11 MED ORDER — PHENYLEPHRINE HCL 10 MG/ML IJ SOLN
INTRAMUSCULAR | Status: DC | PRN
  Administered 2018-10-11: 100 ug via INTRAVENOUS

## 2018-10-11 MED ORDER — ACETAMINOPHEN 325 MG PO TABS: 650.0000 mg | ORAL_TABLET | ORAL | Status: DC | PRN

## 2018-10-11 MED ORDER — ACETAMINOPHEN NICU IV SYRINGE 10 MG/ML
INTRAVENOUS | Status: AC
  Filled 2018-10-11: qty 1

## 2018-10-11 MED ORDER — DEXAMETHASONE SODIUM PHOSPHATE 10 MG/ML IJ SOLN
INTRAMUSCULAR | Status: DC | PRN
  Administered 2018-10-11: 10 mg via INTRAVENOUS

## 2018-10-11 SURGICAL SUPPLY — 79 items
BAG COUNTER SPONGE EZ (MISCELLANEOUS) ×3 IMPLANT
BAG URINE DRAINAGE (UROLOGICAL SUPPLIES) ×4 IMPLANT
BLADE SURG SZ11 CARB STEEL (BLADE) ×4 IMPLANT
CANISTER SUCT 1200ML W/VALVE (MISCELLANEOUS) ×4 IMPLANT
CATH FOLEY 2WAY  5CC 16FR (CATHETERS) ×2
CATH URTH 16FR FL 2W BLN LF (CATHETERS) ×2 IMPLANT
CHLORAPREP W/TINT 26ML (MISCELLANEOUS) ×4 IMPLANT
CLOSURE WOUND 1/2 X4 (GAUZE/BANDAGES/DRESSINGS) ×1
COUNTER SPONGE BAG EZ (MISCELLANEOUS) ×1
COVER WAND RF STERILE (DRAPES) ×4 IMPLANT
DEFOGGER SCOPE WARMER CLEARIFY (MISCELLANEOUS) ×4 IMPLANT
DERMABOND ADVANCED (GAUZE/BANDAGES/DRESSINGS) ×2
DERMABOND ADVANCED .7 DNX12 (GAUZE/BANDAGES/DRESSINGS) ×2 IMPLANT
DEVICE SUTURE ENDOST 10MM (ENDOMECHANICALS) ×4 IMPLANT
DRAPE CAMERA CLOSED 9X96 (DRAPES) ×4 IMPLANT
DRAPE LAP W/FLUID (DRAPES) ×4 IMPLANT
DRAPE LAPAROTOMY 100X77 ABD (DRAPES) IMPLANT
DRAPE LAPAROTOMY TRNSV 106X77 (MISCELLANEOUS) IMPLANT
DRSG TEGADERM 2-3/8X2-3/4 SM (GAUZE/BANDAGES/DRESSINGS) IMPLANT
DRSG TELFA 3X8 NADH (GAUZE/BANDAGES/DRESSINGS) ×4 IMPLANT
ELECT BLADE 6 FLAT ULTRCLN (ELECTRODE) IMPLANT
ELECT CAUTERY BLADE 6.4 (BLADE) IMPLANT
ELECT REM PT RETURN 9FT ADLT (ELECTROSURGICAL) ×4
ELECTRODE REM PT RTRN 9FT ADLT (ELECTROSURGICAL) ×2 IMPLANT
GAUZE SPONGE 4X4 12PLY STRL (GAUZE/BANDAGES/DRESSINGS) IMPLANT
GLOVE BIO SURGEON STRL SZ8 (GLOVE) ×20 IMPLANT
GLOVE INDICATOR 8.0 STRL GRN (GLOVE) ×20 IMPLANT
GOWN STRL REUS W/ TWL LRG LVL3 (GOWN DISPOSABLE) ×2 IMPLANT
GOWN STRL REUS W/ TWL XL LVL3 (GOWN DISPOSABLE) ×4 IMPLANT
GOWN STRL REUS W/TWL LRG LVL3 (GOWN DISPOSABLE) ×2
GOWN STRL REUS W/TWL XL LVL3 (GOWN DISPOSABLE) ×4
GRASPER SUT TROCAR 14GX15 (MISCELLANEOUS) ×4 IMPLANT
HOLDER FOLEY CATH W/STRAP (MISCELLANEOUS) ×4 IMPLANT
IRRIGATION STRYKERFLOW (MISCELLANEOUS) ×2 IMPLANT
IRRIGATOR STRYKERFLOW (MISCELLANEOUS) ×4
IV LACTATED RINGERS 1000ML (IV SOLUTION) ×8 IMPLANT
KIT PINK PAD W/HEAD ARE REST (MISCELLANEOUS) ×4
KIT PINK PAD W/HEAD ARM REST (MISCELLANEOUS) ×2 IMPLANT
KIT TURNOVER CYSTO (KITS) ×4 IMPLANT
KIT TURNOVER KIT A (KITS) ×4 IMPLANT
LABEL OR SOLS (LABEL) ×4 IMPLANT
MANIPULATOR VCARE LG CRV RETR (MISCELLANEOUS) IMPLANT
MANIPULATOR VCARE SML CRV RETR (MISCELLANEOUS) IMPLANT
MANIPULATOR VCARE STD CRV RETR (MISCELLANEOUS) IMPLANT
NEEDLE VERESS 14GA 120MM (NEEDLE) ×4 IMPLANT
NS IRRIG 1000ML POUR BTL (IV SOLUTION) ×4 IMPLANT
NS IRRIG 500ML POUR BTL (IV SOLUTION) ×4 IMPLANT
OCCLUDER COLPOPNEUMO (BALLOONS) ×4 IMPLANT
PACK BASIN MAJOR ARMC (MISCELLANEOUS) IMPLANT
PACK GYN LAPAROSCOPIC (MISCELLANEOUS) ×4 IMPLANT
PAD OB MATERNITY 4.3X12.25 (PERSONAL CARE ITEMS) ×4 IMPLANT
PAD PREP 24X41 OB/GYN DISP (PERSONAL CARE ITEMS) ×4 IMPLANT
PORT ACCESS TROCAR AIRSEAL 12 (TROCAR) ×2 IMPLANT
PORT ACCESS TROCAR AIRSEAL 5M (TROCAR) ×2
SCISSORS METZENBAUM CVD 33 (INSTRUMENTS) IMPLANT
SET CYSTO W/LG BORE CLAMP LF (SET/KITS/TRAYS/PACK) ×4 IMPLANT
SET TRI-LUMEN FLTR TB AIRSEAL (TUBING) ×4 IMPLANT
SHEARS HARMONIC ACE PLUS 36CM (ENDOMECHANICALS) ×4 IMPLANT
SLEEVE ENDOPATH XCEL 5M (ENDOMECHANICALS) ×4 IMPLANT
SPONGE GAUZE 2X2 8PLY STER LF (GAUZE/BANDAGES/DRESSINGS)
SPONGE GAUZE 2X2 8PLY STRL LF (GAUZE/BANDAGES/DRESSINGS) IMPLANT
SPONGE LAP 18X18 RF (DISPOSABLE) IMPLANT
STAPLER SKIN PROX 35W (STAPLE) IMPLANT
STRIP CLOSURE SKIN 1/2X4 (GAUZE/BANDAGES/DRESSINGS) ×3 IMPLANT
SUT ENDO VLOC 180-0-8IN (SUTURE) ×4 IMPLANT
SUT ETHIBOND CT1 BRD #0 30IN (SUTURE) IMPLANT
SUT VIC AB 0 CT1 27 (SUTURE)
SUT VIC AB 0 CT1 27XCR 8 STRN (SUTURE) IMPLANT
SUT VIC AB 0 CT1 36 (SUTURE) ×4 IMPLANT
SUT VIC AB 1 CT1 36 (SUTURE) IMPLANT
SUT VIC AB 4-0 FS2 27 (SUTURE) ×4 IMPLANT
SUT VIC AB 4-0 PS2 18 (SUTURE) IMPLANT
SYR 10ML LL (SYRINGE) ×4 IMPLANT
SYR 30ML LL (SYRINGE) ×4 IMPLANT
SYR 50ML LL SCALE MARK (SYRINGE) ×4 IMPLANT
SYSTEM WECK SHIELD CLOSURE (TROCAR) ×4 IMPLANT
TRAY FOLEY MTR SLVR 16FR STAT (SET/KITS/TRAYS/PACK) IMPLANT
TRAY PREP VAG/GEN (MISCELLANEOUS) IMPLANT
TROCAR XCEL NON-BLD 5MMX100MML (ENDOMECHANICALS) ×4 IMPLANT

## 2018-10-11 NOTE — Interval H&P Note (Signed)
History and Physical Interval Note:  10/11/2018 9:44 AM  Priscilla Fernandez  has presented today for surgery, with the diagnosis of menometrorrhagia,fibroids,pelvic pain  The various methods of treatment have been discussed with the patient and family. After consideration of risks, benefits and other options for treatment, the patient has consented to  Procedure(s): HYSTERECTOMY TOTAL LAPAROSCOPIC with bilateral salpingectomy and POSSIBLE EXPLORATORY LAPAROTOMY  as a surgical intervention .  The patient's history has been reviewed, patient examined, no change in status, stable for surgery.  I have reviewed the patient's chart and labs.  Questions were answered to the patient's satisfaction.     Hoyt Koch

## 2018-10-11 NOTE — Anesthesia Preprocedure Evaluation (Addendum)
Anesthesia Evaluation  Patient identified by MRN, date of birth, ID band Patient awake    Reviewed: Allergy & Precautions, H&P , NPO status , Patient's Chart, lab work & pertinent test results  Airway Mallampati: III       Dental  (+) Teeth Intact   Pulmonary neg pulmonary ROS, neg shortness of breath, neg COPD,           Cardiovascular (-) angina(-) Past MI, (-) Cardiac Stents and (-) DOE (-) dysrhythmias + Valvular Problems/Murmurs (murmur since she was young, asymptomatic)      Neuro/Psych negative neurological ROS  negative psych ROS   GI/Hepatic negative GI ROS, Neg liver ROS,   Endo/Other  negative endocrine ROS  Renal/GU      Musculoskeletal   Abdominal   Peds  Hematology  (+) Blood dyscrasia, anemia ,   Anesthesia Other Findings Past Medical History: No date: Anemia 2013: Breast screening, unspecified No date: Heart murmur  Past Surgical History: 12/16/2016: BREAST BIOPSY; Right     Comment:  NEG; [place] 06/02/2017: BREAST BIOPSY; Right     Comment:  NEG 2004, 2005: BREAST EXCISIONAL BIOPSY; Bilateral     Comment:  multiple 12-08-02: BREAST SURGERY; Right     Comment:  fibrocystic 2005: breast tumor removal  No date: DILATION AND CURETTAGE OF UTERUS  BMI    Body Mass Index:  25.69 kg/m      Reproductive/Obstetrics negative OB ROS                            Anesthesia Physical Anesthesia Plan  ASA: II  Anesthesia Plan: General ETT   Post-op Pain Management:    Induction:   PONV Risk Score and Plan: 4 or greater and Ondansetron, Dexamethasone, Midazolam and Treatment may vary due to age or medical condition  Airway Management Planned:   Additional Equipment:   Intra-op Plan:   Post-operative Plan:   Informed Consent: I have reviewed the patients History and Physical, chart, labs and discussed the procedure including the risks, benefits and alternatives for  the proposed anesthesia with the patient or authorized representative who has indicated his/her understanding and acceptance.   Dental Advisory Given  Plan Discussed with: Anesthesiologist and CRNA  Anesthesia Plan Comments:        Anesthesia Quick Evaluation

## 2018-10-11 NOTE — Anesthesia Post-op Follow-up Note (Signed)
Anesthesia QCDR form completed.        

## 2018-10-11 NOTE — Discharge Instructions (Signed)

## 2018-10-11 NOTE — Transfer of Care (Signed)
Immediate Anesthesia Transfer of Care Note  Patient: Priscilla Fernandez  Procedure(s) Performed: HYSTERECTOMY TOTAL LAPAROSCOPIC bilateral salpingectomy (N/A ) CYSTOSCOPY  Patient Location: PACU  Anesthesia Type:General  Level of Consciousness: awake and patient cooperative  Airway & Oxygen Therapy: Patient Spontanous Breathing and Patient connected to face mask  Post-op Assessment: Report given to RN  Post vital signs: stable  Last Vitals:  Vitals Value Taken Time  BP 143/72 10/11/2018  1:15 PM  Temp    Pulse 82 10/11/2018  1:23 PM  Resp 16 10/11/2018  1:23 PM  SpO2 100 % 10/11/2018  1:23 PM  Vitals shown include unvalidated device data.  Last Pain:  Vitals:   10/11/18 1300  TempSrc:   PainSc: Asleep         Complications: No apparent anesthesia complications

## 2018-10-11 NOTE — Op Note (Signed)
Operative Report:  PRE-OP DIAGNOSIS: menometrorrhagia, fibroid, pelvic pain   POST-OP DIAGNOSIS: menometrorrhagia, fibroid, pelvic pain   PROCEDURE: Procedure(s): HYSTERECTOMY TOTAL LAPAROSCOPIC with bilateral salpingectomy CYSTOSCOPY  SURGEON: Barnett Applebaum, MD, FACOG  ASSISTANT: Dr Marisue Brooklyn, No other capable assistant available, in surgery requiring high level assistant.  ANESTHESIA: General endotracheal anesthesia  ESTIMATED BLOOD LOSS: less than 50   SPECIMENS: Uterus, Tubes.  COMPLICATIONS: None  DISPOSITION: stable to PACU  FINDINGS: Intraabdominal adhesions were not noted. Multiple moderate sized fibroids noted.  PROCEDURE:  The patient was taken to the OR where anesthesia was administed. She was prepped and draped in the normal sterile fashion in the dorsal lithotomy position in the [place] stirrups. A time out was performed. A Graves speculum was inserted, the cervix was grasped with a single tooth tenaculum and the endometrial cavity was sounded. The cervix was progressively dilated to a size 18 [place] with [employer] dilators. A V-Care uterine manipulator was inserted in the usual fashion without incident. Gloves were changed and attention was turned to the abdomen.   An infraumbilical transverse 10mm skin incision was made with the scalpel after local anesthesia applied to the skin. A Veress-step needle was inserted in the usual fashion and confirmed using the hanging drop technique. A pneumoperitoneum was obtained by insufflation of CO2 (opening pressure of 64mmHg) to 63mmHg. A diagnostic laparoscopy was performed yielding the previously described findings. Attention was turned to the left lower quadrant where after visualization of the inferior epigastric vessels a 70mm skin incision was made with the scalpel. A 5 mm laparoscopic port was inserted. The same procedure was repeated in the right lower quadrant with a 50mm trocar. Attention was turned to the left aspect of the uterus,  where after visualization of the ureter, the round ligament was coagulated and transected using the 69mm Harmonic Scapel. The anterior and posterior leafs of the broad ligament were dissected off as the anterior one was coagulated and transected in a caudal direction towards the cuff of the uterine manipulator.  Attention was then turned to the left fallopian tube which was recognized by visualization of the fimbria. The tube is excised to its attachment to the uterus. The uterine-ovarian ligament and its blood vessels were carefully coagulated and transected using the Harmonic scapel.  Attention was turned to the right aspect of the uterus where the same procedure was performed.  The vesicouterine reflection of the peritoneum was dissected with the harmonic scapel and the bladder flap was created bluntly.  The uterine vessels were coagulated and transected bilaterally using first bipolar cautery and then the harmonic scapel. A 360 degree, circumferential colpotomy was done to completely amputate the uterus with cervix and tubes. Once the specimen was amputated it was delivered through the vagina.   The colpotomy was repaired in a simple running fashion using a delayed absorbable suture with an endo-stitch device.  Vaginal exam confirms complete closure.  The cavity was copiously irrigated. A survey of the pelvic cavity revealed adequate hemostasis and no injury to bowel, bladder, or ureter.   A diagnostic cystoscopy was performed using saline distension of bladder with no lesions or injuries noted.  Bilateral urine flow from each ureteral orifice is visualized.  At this point the procedure was finalized. Right lower quadrant fascia incision is closed with a vicryl suture using the fascia closure device. All the instruments were removed from the patient's body. Gas was expelled and patient is leveled.  Incisions are closed with skin adhesive.    Patient  goes to recovery room in stable condition.  All sponge,  instrument, and needle counts are correct x2.     Barnett Applebaum, MD, [facility], [facility] 10/11/2018  12:30 PM

## 2018-10-11 NOTE — Progress Notes (Signed)
Voided x2

## 2018-10-11 NOTE — Progress Notes (Signed)
Minimal drainage on peri pad

## 2018-10-11 NOTE — Anesthesia Procedure Notes (Signed)
Procedure Name: Intubation Date/Time: 10/11/2018 10:45 AM Performed by: Lavone Orn, CRNA Pre-anesthesia Checklist: Patient identified, Emergency Drugs available, Suction available, Patient being monitored and Timeout performed Patient Re-evaluated:Patient Re-evaluated prior to induction Oxygen Delivery Method: Circle system utilized Preoxygenation: Pre-oxygenation with 100% oxygen Induction Type: IV induction Ventilation: Mask ventilation without difficulty Laryngoscope Size: Mac and 3 Grade View: Grade I Tube type: Oral Tube size: 7.0 mm Number of attempts: 1 Airway Equipment and Method: Stylet (cricoid pressure) Placement Confirmation: ETT inserted through vocal cords under direct vision,  positive ETCO2 and breath sounds checked- equal and bilateral Secured at: 21 cm Tube secured with: Tape Dental Injury: Teeth and Oropharynx as per pre-operative assessment

## 2018-10-11 NOTE — Progress Notes (Signed)
Pt up to bathroom to void   Voided without difficutly

## 2018-10-12 ENCOUNTER — Encounter: Payer: Self-pay | Admitting: Obstetrics & Gynecology

## 2018-10-12 NOTE — Anesthesia Postprocedure Evaluation (Signed)
Anesthesia Post Note  Patient: [name]  Procedure(s) Performed: HYSTERECTOMY TOTAL LAPAROSCOPIC bilateral salpingectomy (N/A ) CYSTOSCOPY  Patient location during evaluation: PACU Anesthesia Type: General Level of consciousness: awake and alert Pain management: pain level controlled Vital Signs Assessment: post-procedure vital signs reviewed and stable Respiratory status: spontaneous breathing, nonlabored ventilation, respiratory function stable and patient connected to nasal cannula oxygen Cardiovascular status: blood pressure returned to baseline and stable Postop Assessment: no apparent nausea or vomiting Anesthetic complications: no     Last Vitals:  Vitals:   10/11/18 1405 10/11/18 1431  BP: (!) 145/77 (!) 141/80  Pulse: 69 72  Resp: 16   Temp: (!) 36.1 C   SpO2: 100% 100%    Last Pain:  Vitals:   10/11/18 1431  TempSrc:   PainSc: 0-No pain                 Durenda Hurt

## 2018-10-14 LAB — SURGICAL PATHOLOGY

## 2018-10-21 ENCOUNTER — Encounter: Payer: Self-pay | Admitting: Obstetrics & Gynecology

## 2018-10-21 ENCOUNTER — Ambulatory Visit (INDEPENDENT_AMBULATORY_CARE_PROVIDER_SITE_OTHER): Payer: 59 | Admitting: Obstetrics & Gynecology

## 2018-10-21 VITALS — BP 120/70 | Ht 64.0 in | Wt 136.0 lb

## 2018-10-21 DIAGNOSIS — D251 Intramural leiomyoma of uterus: Secondary | ICD-10-CM

## 2018-10-21 NOTE — Progress Notes (Signed)
  Postoperative Follow-up Patient presents post op from [facility] BS for fibroids, 1 week ago. Pathology: DIAGNOSIS:  A. UTERUS WITH CERVIX; HYSTERECTOMY:  - CERVIX WITHOUT PATHOLOGIC CHANGES.  - PROLIFERATIVE ENDOMETRIUM.  - INTRAMURAL, SUBMUCOSAL, AND SUBSEROSAL LEIOMYOMAS (384 GRAM UTERUS).  - ADENOMYOSIS.  FALLOPIAN TUBES, BILATERAL; SALPINGECTOMY:  - PARATUBAL CYSTS, LEFT.  Images:   Subjective: Patient reports marked improvement in her preop symptoms. Eating a regular diet without difficulty. The patient is not having any pain.  Activity: sedentary. Patient reports additional symptom's since surgery of None.  Objective: BP 120/70   Ht 5\' 4"  (1.626 m)   Wt 136 lb (61.7 kg)   LMP 10/04/2018 (Exact Date)   BMI 23.34 kg/m  Physical Exam  Constitutional: She is oriented to person, place, and time. She appears well-developed and well-nourished. No distress.  Cardiovascular: Normal rate.  Pulmonary/Chest: Effort normal.  Abdominal: Soft. She exhibits no distension. There is no tenderness.  Incision Healing Well   Musculoskeletal: Normal range of motion.  Neurological: She is alert and oriented to person, place, and time. No cranial nerve deficit.  Skin: Skin is warm and dry.  Psychiatric: She has a normal mood and affect.    Assessment: s/p :  total laparoscopic hysterectomy with bilateral salpingectomy progressing well  Plan: Patient has done well after surgery with no apparent complications.  I have discussed the post-operative course to date, and the expected progress moving forward.  The patient understands what complications to be concerned about.  I will see the patient in routine follow up, or sooner if needed.    Activity plan: No heavy lifting. Pelvic rest  Hoyt Koch 10/21/2018, 9:53 AM

## 2018-11-02 ENCOUNTER — Ambulatory Visit
Admission: RE | Admit: 2018-11-02 | Discharge: 2018-11-02 | Disposition: A | Discharge status: Home / Self Care | Payer: 59 | Source: Ambulatory Visit | Attending: Obstetrics & Gynecology | Admitting: Obstetrics & Gynecology

## 2018-11-02 ENCOUNTER — Encounter: Payer: Self-pay | Admitting: Obstetrics & Gynecology

## 2018-11-02 DIAGNOSIS — Z1231 Encounter for screening mammogram for malignant neoplasm of breast: Secondary | ICD-10-CM | POA: Diagnosis not present

## 2018-11-07 ENCOUNTER — Encounter: Payer: Self-pay | Admitting: Obstetrics & Gynecology

## 2018-11-07 ENCOUNTER — Ambulatory Visit (INDEPENDENT_AMBULATORY_CARE_PROVIDER_SITE_OTHER): Payer: 59 | Admitting: Obstetrics & Gynecology

## 2018-11-07 VITALS — BP 150/80 | Ht 64.0 in | Wt 138.0 lb

## 2018-11-07 DIAGNOSIS — Z01419 Encounter for gynecological examination (general) (routine) without abnormal findings: Secondary | ICD-10-CM

## 2018-11-07 NOTE — Patient Instructions (Addendum)
PAP up to date Mammogram every year (up to date 10/2018) Colonoscopy next year Labs yearly (with PCP)

## 2018-11-07 NOTE — Progress Notes (Signed)
HPI:      [name] is a 47 y.o. G2P1011 who LMP was Patient's last menstrual period was 10/04/2018 (exact date). as she then had [facility] BS done for fibroid uterus, she presents today for her annual examination. The patient has no complaints today. The patient is not currently sexually active. Her last pap: was normal and last mammogram: approximate date 10/2018 and was normal. The patient does perform self breast exams.  There is no notable family history of breast or ovarian cancer in her family.  The patient has regular exercise: yes.  The patient denies current symptoms of depression.    GYN History: Contraception: status post hysterectomy  PMHx: Past Medical History:  Diagnosis Date  . Anemia   . Breast screening, unspecified 2013  . Heart murmur    Past Surgical History:  Procedure Laterality Date  . ABDOMINAL HYSTERECTOMY    . BREAST BIOPSY Right 12/16/2016   NEG; [facility]  . BREAST BIOPSY Right 06/02/2017   NEG  . BREAST EXCISIONAL BIOPSY Bilateral 2004, 2005   multiple  . BREAST SURGERY Right 12-08-02   fibrocystic  . breast tumor removal   2005  . CYSTOSCOPY  10/11/2018   Procedure: CYSTOSCOPY;  Surgeon: Priscilla Dry, MD;  Location: [facility] ORS;  Service: Gynecology;;  . DILATION AND CURETTAGE OF UTERUS    . LAPAROSCOPIC HYSTERECTOMY N/A 10/11/2018   Procedure: HYSTERECTOMY TOTAL LAPAROSCOPIC bilateral salpingectomy;  Surgeon: Priscilla Dry, MD;  Location: [facility] ORS;  Service: Gynecology;  Laterality: N/A;   Family History  Problem Relation Age of Onset  . Hypertension Father   . Breast cancer Maternal Grandmother 21  . Cancer Paternal Grandmother        colon or rectal  . Lung cancer Paternal Grandfather    Social History   Tobacco Use  . Smoking status: Never Smoker  . Smokeless tobacco: Never Used  Substance Use Topics  . Alcohol use: Yes    Comment: rare  . Drug use: No    Current Outpatient Medications:  .  ferrous sulfate 325 (65 FE) MG  EC tablet, Take 325 mg by mouth daily with breakfast. , Disp: , Rfl:  .  fluticasone (FLONASE) 50 MCG/ACT nasal spray, Place 1 spray into both nostrils daily. (Patient taking differently: Place 1 spray into both nostrils as needed. ), Disp: 16 g, Rfl: 2 .  vitamin C (ASCORBIC ACID) 500 MG tablet, Take 500 mg by mouth daily. , Disp: , Rfl:  .  ibuprofen (ADVIL,MOTRIN) 800 MG tablet, Take 1 tablet (800 mg total) by mouth 3 (three) times daily. (Patient not taking: Reported on 11/07/2018), Disp: 21 tablet, Rfl: 0 .  oxyCODONE-acetaminophen (PERCOCET/ROXICET) 5-325 MG tablet, Take 1 tablet by mouth every 4 (four) hours as needed for moderate pain. (Patient not taking: Reported on 10/21/2018), Disp: 30 tablet, Rfl: 0 No current facility-administered medications for this visit.   Facility-Administered Medications Ordered in Other Visits:  .  0.9 %  sodium chloride infusion, , Intravenous, Continuous, Fernandez, Dmitriy, MD Allergies: Patient has no known allergies.  Review of Systems  Constitutional: Negative for chills, fever and malaise/fatigue.  HENT: Negative for congestion, sinus pain and sore throat.   Eyes: Negative for blurred vision and pain.  Respiratory: Negative for cough and wheezing.   Cardiovascular: Negative for chest pain and leg swelling.  Gastrointestinal: Negative for abdominal pain, constipation, diarrhea, heartburn, nausea and vomiting.  Genitourinary: Negative for dysuria, frequency, hematuria and urgency.  Musculoskeletal: Negative for  back pain, joint pain, myalgias and neck pain.  Skin: Negative for itching and rash.  Neurological: Negative for dizziness, tremors and weakness.  Endo/Heme/Allergies: Does not bruise/bleed easily.  Psychiatric/Behavioral: Negative for depression. The patient is not nervous/anxious and does not have insomnia.    Objective: BP (!) 150/80   Ht 5\' 4"  (1.626 m)   Wt 138 lb (62.6 kg)   LMP 10/04/2018 (Exact Date)   BMI 23.69 kg/m   Filed  Weights   11/07/18 1521  Weight: 138 lb (62.6 kg)   Body mass index is 23.69 kg/m. Physical Exam Constitutional:      General: She is not in acute distress.    Appearance: She is well-developed.  HENT:     Head: Normocephalic and atraumatic. No laceration.     Right Ear: Hearing normal.     Left Ear: Hearing normal.     Mouth/Throat:     Pharynx: Uvula midline.  Eyes:     Pupils: Pupils are equal, round, and reactive to light.  Neck:     Musculoskeletal: Normal range of motion and neck supple.     Thyroid: No thyromegaly.  Cardiovascular:     Rate and Rhythm: Normal rate and regular rhythm.     Heart sounds: No murmur. No friction rub. No gallop.   Pulmonary:     Effort: Pulmonary effort is normal. No respiratory distress.     Breath sounds: Normal breath sounds. No wheezing.  Chest:     Breasts:        Right: No mass, skin change or tenderness.        Left: No mass, skin change or tenderness.  Abdominal:     General: Bowel sounds are normal. There is no distension.     Palpations: Abdomen is soft.     Tenderness: There is no abdominal tenderness. There is no rebound.  Musculoskeletal: Normal range of motion.  Neurological:     Mental Status: She is alert and oriented to person, place, and time.     Cranial Nerves: No cranial nerve deficit.  Skin:    General: Skin is warm and Fernandez.  Psychiatric:        Judgment: Judgment normal.  Vitals signs reviewed.   Assessment: 1. Women's annual routine gynecological examination    Screening Plan:            1.  Cervical Screening-  Pap smear schedule reviewed with patient  2. Breast screening- Exam annually and mammogram>40 planned   3. Colonoscopy every 10 years starting next year, Hemoccult testing - after age 75  4. Labs managed by PCP  5. Counseling for contraception: none needed, recent hysterectomy     F/U  Return in about 1 year (around 11/08/2019) for Annual.  Priscilla Applebaum, MD, Priscilla Fernandez [facility]Gyn, [facility] 11/07/2018  3:23 PM

## 2018-11-25 ENCOUNTER — Encounter: Payer: Self-pay | Admitting: Obstetrics & Gynecology

## 2018-11-25 ENCOUNTER — Ambulatory Visit (INDEPENDENT_AMBULATORY_CARE_PROVIDER_SITE_OTHER): Payer: 59 | Admitting: Obstetrics & Gynecology

## 2018-11-25 VITALS — BP 140/80 | Ht 64.0 in | Wt 138.0 lb

## 2018-11-25 DIAGNOSIS — N921 Excessive and frequent menstruation with irregular cycle: Secondary | ICD-10-CM

## 2018-11-25 DIAGNOSIS — D251 Intramural leiomyoma of uterus: Secondary | ICD-10-CM

## 2018-11-25 NOTE — Progress Notes (Signed)
  Postoperative Follow-up Patient presents post op from [facility] for abnormal uterine bleeding and fibroids, 6 weeks ago.  Subjective: Patient reports marked improvement in her preop symptoms. Eating a regular diet without difficulty. The patient is not having any pain.  Activity: normal activities of daily living. Patient reports additional symptom's since surgery of None.  Objective: BP 140/80   Ht 5\' 4"  (1.626 m)   Wt 138 lb (62.6 kg)   LMP 10/04/2018 (Exact Date)   BMI 23.69 kg/m  Physical Exam Constitutional:      General: She is not in acute distress.    Appearance: She is well-developed.  Genitourinary:     Pelvic exam was performed with patient supine.     Vagina and rectum normal.     No vaginal erythema or bleeding.     No right or left adnexal mass present.     Right adnexa not tender.     Left adnexa not tender.     Genitourinary Comments: Cervix and uterus absent. Vaginal cuff healing well.  Cardiovascular:     Rate and Rhythm: Normal rate.  Pulmonary:     Effort: Pulmonary effort is normal.  Abdominal:     General: There is no distension.     Palpations: Abdomen is soft.     Tenderness: There is no abdominal tenderness.     Comments: Incision healing well.  Musculoskeletal: Normal range of motion.  Neurological:     Mental Status: She is alert and oriented to person, place, and time.     Cranial Nerves: No cranial nerve deficit.  Skin:    General: Skin is warm and dry.   Assessment: s/p :  total laparoscopic hysterectomy with bilateral salpingectomy progressing well  Plan: Patient has done well after surgery with no apparent complications.  I have discussed the post-operative course to date, and the expected progress moving forward.  The patient understands what complications to be concerned about.  I will see the patient in routine follow up, or sooner if needed.    Activity plan: No restriction.  Hoyt Koch 11/25/2018, 4:42 PM

## 2018-12-04 IMAGING — MG MM DIGITAL SCREENING BILAT W/ TOMO W/ CAD
9 of 12 series · 9 of 28 positions shown · non-contrast
Comparison: Previous exam(s).

CLINICAL DATA: Screening.

EXAM:
2D DIGITAL SCREENING BILATERAL MAMMOGRAM WITH CAD AND ADJUNCT TOMO

[R MLO]
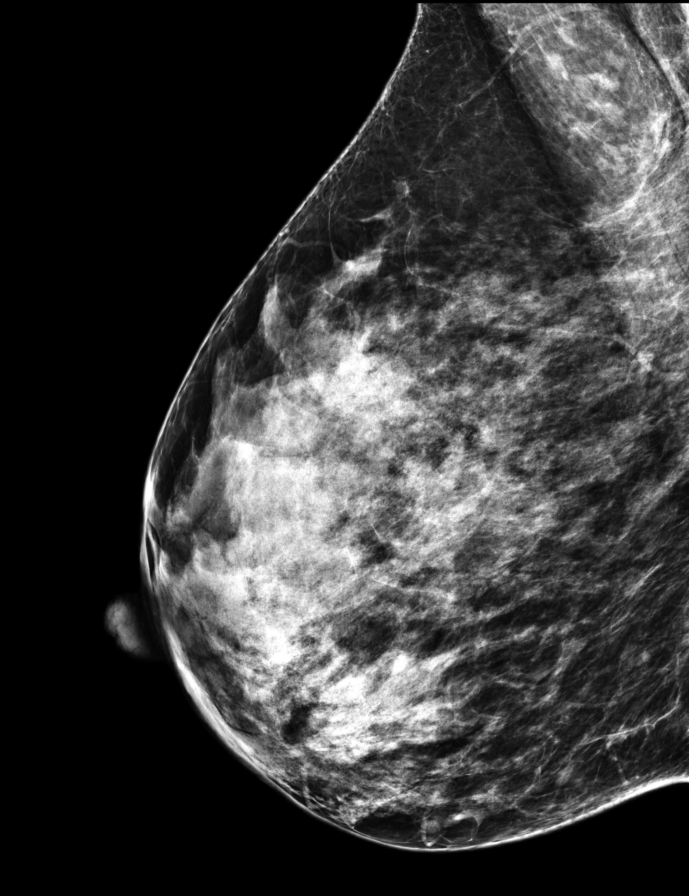

[L MLO synth-2D]
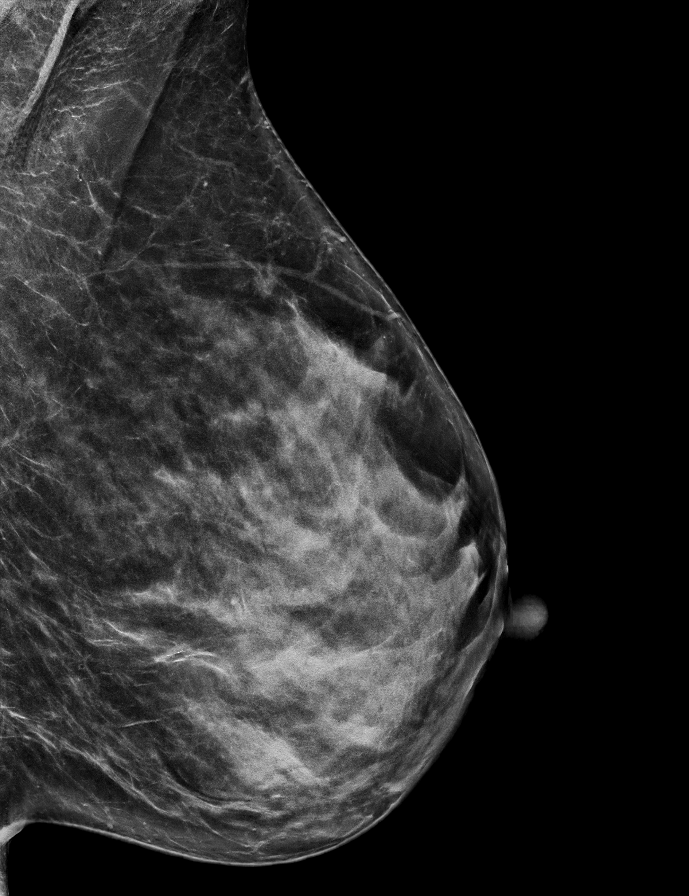

[L CC synth-2D]
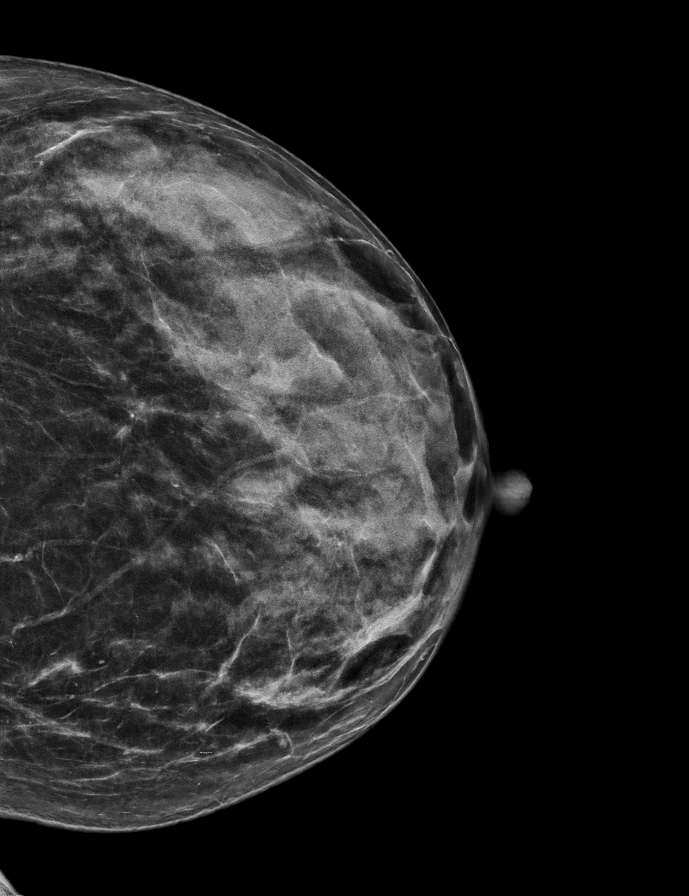

[L CC]
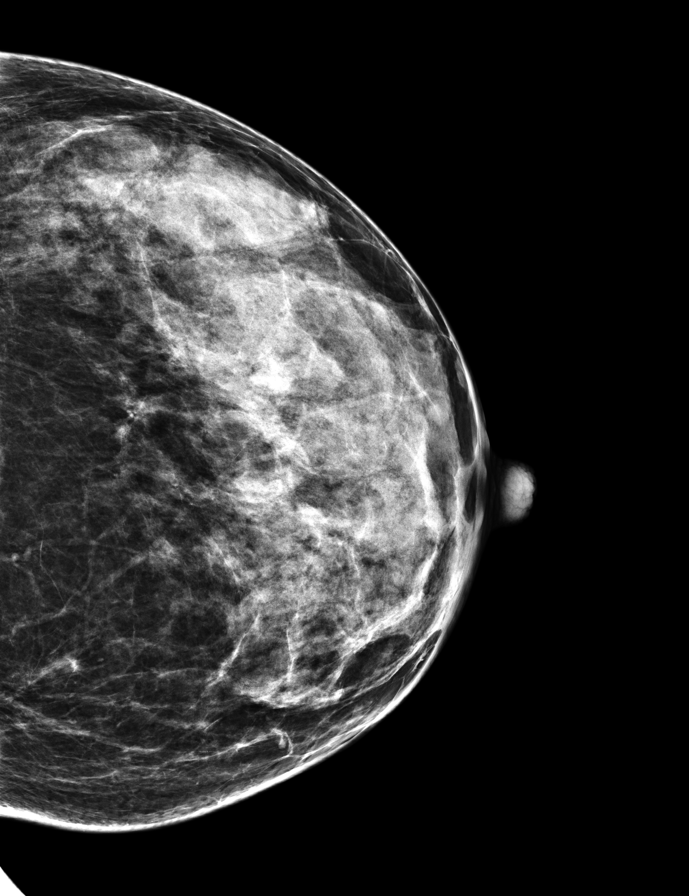

[R CC synth-2D]
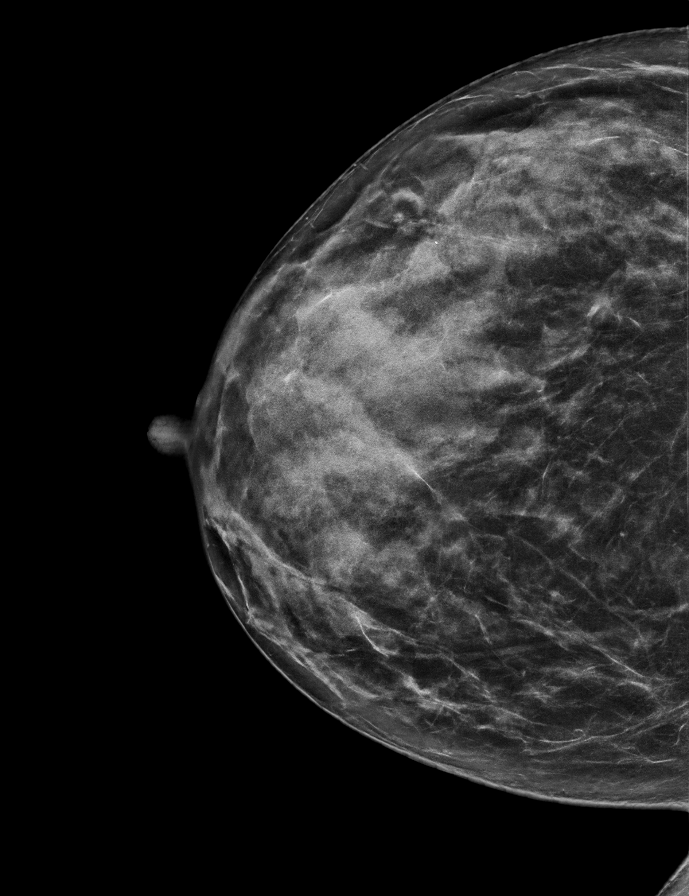

[R CC]
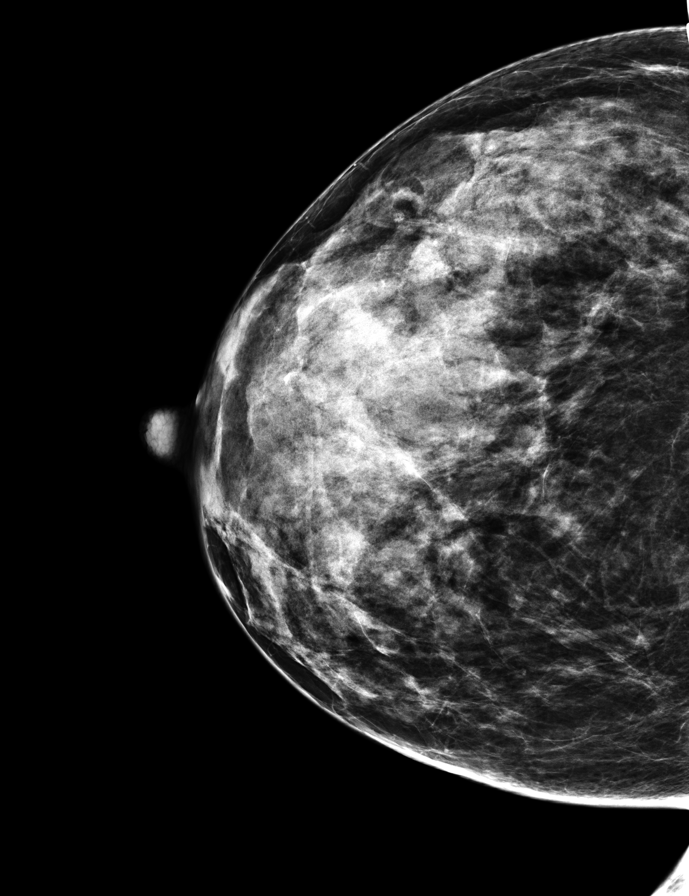

[L MLO]
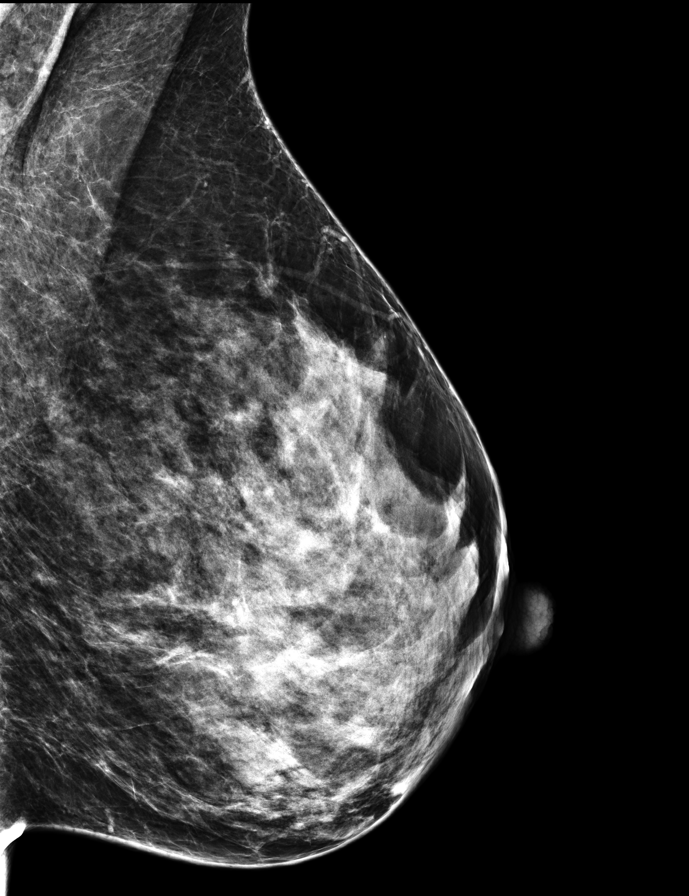

[R MLO synth-2D]
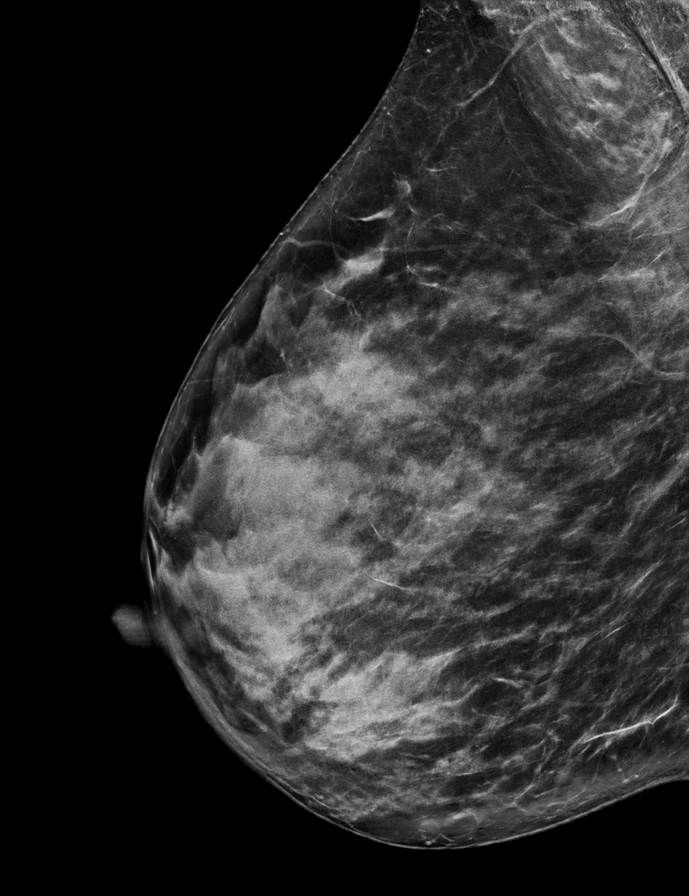

[R CC tomo · tomo slice 34/67.0]
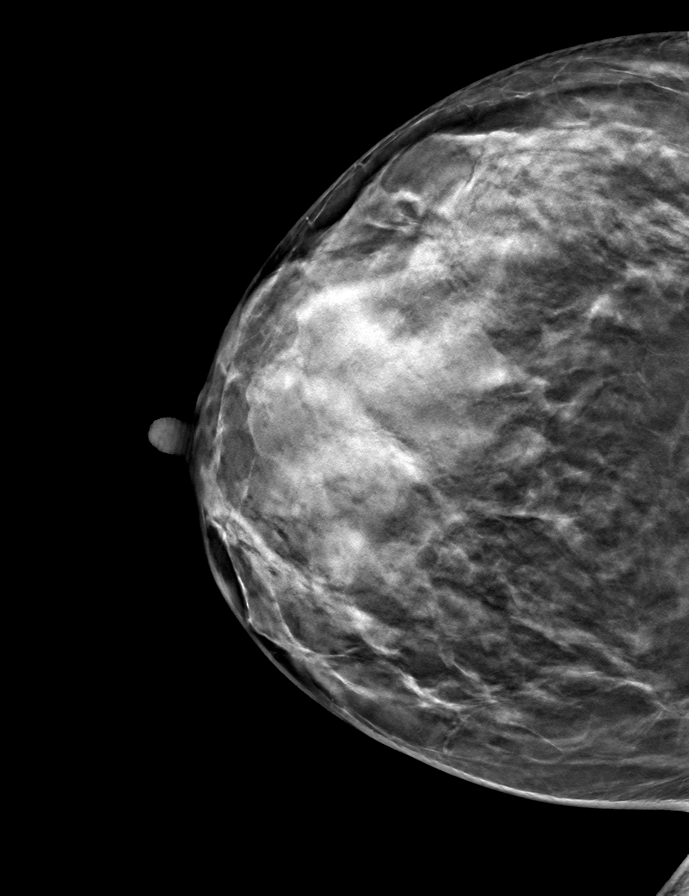

[9 of 28 positions shown; findings below may reference images not displayed]

ACR Breast Density Category d: The breast tissue is extremely dense,
which lowers the sensitivity of mammography.
FINDINGS: In the right breast, possible distortion warrants further
evaluation. In the left breast, no findings suspicious for
malignancy. Images were processed with CAD.
IMPRESSION: Further evaluation is suggested for possible distortion in the right
breast.

RECOMMENDATION:
Diagnostic mammogram and possibly ultrasound of the right breast.
(Code:4C-M-IIC)

The patient will be contacted regarding the findings, and additional
imaging will be scheduled.

BI-RADS CATEGORY  0: Incomplete. Need additional imaging evaluation
and/or prior mammograms for comparison.

## 2018-12-06 ENCOUNTER — Ambulatory Visit (INDEPENDENT_AMBULATORY_CARE_PROVIDER_SITE_OTHER): Payer: 59 | Admitting: Obstetrics & Gynecology

## 2018-12-06 ENCOUNTER — Telehealth: Payer: Self-pay

## 2018-12-06 ENCOUNTER — Encounter: Payer: Self-pay | Admitting: Obstetrics & Gynecology

## 2018-12-06 VITALS — BP 150/90 | Ht 64.0 in | Wt 134.0 lb

## 2018-12-06 DIAGNOSIS — R102 Pelvic and perineal pain: Secondary | ICD-10-CM

## 2018-12-06 NOTE — Progress Notes (Signed)
History of Present Illness:  Priscilla Fernandez is a 48 y.o. who had recent pain w intercourse, w some bleeding as well, yesterday.  First time sexually active since surgery last nov (hysterectomy).  Lingering pain and light bleeding today.  PMHx: She  has a past medical history of Anemia, Breast screening, unspecified (2013), and Heart murmur. Also,  has a past surgical history that includes breast tumor removal  (2005); Dilation and curettage of uterus; Breast surgery (Right, 12-08-02); Laparoscopic hysterectomy (N/A, 10/11/2018); Cystoscopy (10/11/2018); Abdominal hysterectomy; Breast biopsy (Right, 12/16/2016); Breast biopsy (Right, 06/02/2017); and Breast excisional biopsy (Bilateral, 2004, 2005)., family history includes Breast cancer (age of onset: 35) in her maternal grandmother; Cancer in her paternal grandmother; Hypertension in her father; Lung cancer in her paternal grandfather.,  reports that she has never smoked. She has never used smokeless tobacco. She reports current alcohol use. She reports that she does not use drugs.  Current Outpatient Medications:  .  fluticasone (FLONASE) 50 MCG/ACT nasal spray, Place 1 spray into both nostrils daily. (Patient taking differently: Place 1 spray into both nostrils as needed. ), Disp: 16 g, Rfl: 2 .  ibuprofen (ADVIL,MOTRIN) 800 MG tablet, Take 1 tablet (800 mg total) by mouth 3 (three) times daily., Disp: 21 tablet, Rfl: 0 .  vitamin C (ASCORBIC ACID) 500 MG tablet, Take 500 mg by mouth daily. , Disp: , Rfl:  No current facility-administered medications for this visit.   Facility-Administered Medications Ordered in Other Visits:  .  0.9 %  sodium chloride infusion, , Intravenous, Continuous, Berenzon, Dmitriy, MD   . Also, has No Known Allergies..  Review of Systems  Constitutional: Negative for chills, fever and malaise/fatigue.  HENT: Negative for congestion, sinus pain and sore throat.   Eyes: Negative for blurred vision and pain.    Respiratory: Negative for cough and wheezing.   Cardiovascular: Negative for chest pain and leg swelling.  Gastrointestinal: Negative for abdominal pain, constipation, diarrhea, heartburn, nausea and vomiting.  Genitourinary: Negative for dysuria, frequency, hematuria and urgency.  Musculoskeletal: Negative for back pain, joint pain, myalgias and neck pain.  Skin: Negative for itching and rash.  Neurological: Negative for dizziness, tremors and weakness.  Endo/Heme/Allergies: Does not bruise/bleed easily.  Psychiatric/Behavioral: Negative for depression. The patient is not nervous/anxious and does not have insomnia.    Physical Exam:  BP (!) 150/90   Ht 5\' 4"  (1.626 m)   Wt 134 lb (60.8 kg)   LMP 10/04/2018 (Exact Date)   BMI 23.00 kg/m  Body mass index is 23 kg/m. Constitutional: Well nourished, well developed female in no acute distress.  Abdomen: diffusely non tender to palpation, non distended, and no masses, hernias Neuro: Grossly intact Psych:  Normal mood and affect.  Physical Exam Genitourinary:     Vulva and vagina normal.     Genitourinary Comments: Cuff seemingly intact on exam and w pressure from qtip although tender and peach spotting on qtip   Assessment:  Problem List Items Addressed This Visit      Other   Pelvic pain - Primary    Pelvic rest and allow more time to heal from hysterectomy, no other lesions or source of bleeding. Out of work today and tomorrow. Recheck 2-3 weeks  A total of 15 minutes were spent face-to-face with the patient during this encounter and over half of that time dealt with counseling and coordination of care.  Barnett Applebaum, MD, [facility], [facility] 12/06/2018  3:51 PM

## 2018-12-06 NOTE — Telephone Encounter (Signed)
Work in [employer]

## 2018-12-06 NOTE — Telephone Encounter (Signed)
Post op patient calling today c/o a lot of pain. Should she just come in to be seen by [facility]?

## 2018-12-06 NOTE — Telephone Encounter (Signed)
Pt had intercourse and the pain is bad, during and after, also light bleeding. She reports cramps, bloating, she took today off so she could see you, Please advise

## 2018-12-07 NOTE — Telephone Encounter (Signed)
Patient seen on 12/06/18 with [facility]

## 2018-12-23 ENCOUNTER — Encounter: Payer: Self-pay | Admitting: Obstetrics & Gynecology

## 2018-12-23 ENCOUNTER — Ambulatory Visit (INDEPENDENT_AMBULATORY_CARE_PROVIDER_SITE_OTHER): Payer: 59 | Admitting: Obstetrics & Gynecology

## 2018-12-23 VITALS — BP 150/90 | Ht 64.0 in | Wt 139.0 lb

## 2018-12-23 DIAGNOSIS — Z90710 Acquired absence of both cervix and uterus: Secondary | ICD-10-CM | POA: Diagnosis not present

## 2018-12-23 DIAGNOSIS — N939 Abnormal uterine and vaginal bleeding, unspecified: Secondary | ICD-10-CM

## 2018-12-23 NOTE — Progress Notes (Signed)
  History of Present Illness:  Priscilla Fernandez is a 48 y.o. who was having some bleeding after sex that has improved recently (spotting yesterday after sex).  [facility] Nov 2019.  ALso has some gas like pains in upper abd after sex.  No bloating, just the feeling.  PMHx: She  has a past medical history of Anemia, Breast screening, unspecified (2013), and Heart murmur. Also,  has a past surgical history that includes breast tumor removal  (2005); Dilation and curettage of uterus; Breast surgery (Right, 12-08-02); Laparoscopic hysterectomy (N/A, 10/11/2018); Cystoscopy (10/11/2018); Abdominal hysterectomy; Breast biopsy (Right, 12/16/2016); Breast biopsy (Right, 06/02/2017); and Breast excisional biopsy (Bilateral, 2004, 2005)., family history includes Breast cancer (age of onset: 42) in her maternal grandmother; Cancer in her paternal grandmother; Hypertension in her father; Lung cancer in her paternal grandfather.,  reports that she has never smoked. She has never used smokeless tobacco. She reports current alcohol use. She reports that she does not use drugs. No outpatient medications have been marked as taking for the 12/23/18 encounter (Office Visit) with Gae Dry, MD.  . Also, has No Known Allergies..  Review of Systems  All other systems reviewed and are negative.  Physical Exam:  BP (!) 150/90   Ht 5\' 4"  (1.626 m)   Wt 139 lb (63 kg)   LMP 10/04/2018 (Exact Date)   BMI 23.86 kg/m  Body mass index is 23.86 kg/m. Constitutional: Well nourished, well developed female in no acute distress.  Abdomen: diffusely non tender to palpation, non distended, and no masses, hernias Neuro: Grossly intact Psych:  Normal mood and affect.    Assessment:  Problem List Items Addressed This Visit      Other   History of hysterectomy - Primary    Other Visit Diagnoses    Vaginal bleeding        Monitor for continued bleeding or worsening pain.  Expect it to improve w time.  Consider exam and  CT if worsens.   She was amenable to this plan and we will see her back for annual/PRN.  Barnett Applebaum, MD, [facility], [facility] 12/23/2018  2:59 PM

## 2019-01-03 DIAGNOSIS — M436 Torticollis: Secondary | ICD-10-CM | POA: Diagnosis not present

## 2019-01-03 DIAGNOSIS — S199XXA Unspecified injury of neck, initial encounter: Secondary | ICD-10-CM | POA: Diagnosis not present

## 2019-01-03 DIAGNOSIS — M542 Cervicalgia: Secondary | ICD-10-CM | POA: Diagnosis not present

## 2019-01-20 ENCOUNTER — Telehealth: Payer: Self-pay
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
## 2019-01-25 DIAGNOSIS — S39012A Strain of muscle, fascia and tendon of lower back, initial encounter: Secondary | ICD-10-CM | POA: Insufficient documentation

## 2019-01-25 DIAGNOSIS — I10 Essential (primary) hypertension: Secondary | ICD-10-CM | POA: Diagnosis not present

## 2019-01-25 DIAGNOSIS — D508 Other iron deficiency anemias: Secondary | ICD-10-CM | POA: Diagnosis not present

## 2019-01-25 DIAGNOSIS — R9389 Abnormal findings on diagnostic imaging of other specified body structures: Secondary | ICD-10-CM | POA: Diagnosis not present

## 2019-02-21 DIAGNOSIS — Z9109 Other allergy status, other than to drugs and biological substances: Secondary | ICD-10-CM | POA: Insufficient documentation

## 2019-02-21 DIAGNOSIS — I10 Essential (primary) hypertension: Secondary | ICD-10-CM | POA: Diagnosis not present

## 2019-02-21 DIAGNOSIS — D508 Other iron deficiency anemias: Secondary | ICD-10-CM | POA: Diagnosis not present

## 2019-02-21 DIAGNOSIS — R9389 Abnormal findings on diagnostic imaging of other specified body structures: Secondary | ICD-10-CM | POA: Diagnosis not present

## 2019-02-21 DIAGNOSIS — Z23 Encounter for immunization: Secondary | ICD-10-CM | POA: Diagnosis not present

## 2019-02-21 DIAGNOSIS — Z0000 Encounter for general adult medical examination without abnormal findings: Secondary | ICD-10-CM | POA: Diagnosis not present

## 2019-02-21 DIAGNOSIS — E042 Nontoxic multinodular goiter: Secondary | ICD-10-CM | POA: Diagnosis not present

## 2019-02-21 DIAGNOSIS — R930 Abnormal findings on diagnostic imaging of skull and head, not elsewhere classified: Secondary | ICD-10-CM | POA: Diagnosis not present

## 2019-03-07 DIAGNOSIS — I10 Essential (primary) hypertension: Secondary | ICD-10-CM | POA: Diagnosis not present

## 2019-03-07 DIAGNOSIS — R9389 Abnormal findings on diagnostic imaging of other specified body structures: Secondary | ICD-10-CM | POA: Diagnosis not present

## 2019-03-07 DIAGNOSIS — Z9109 Other allergy status, other than to drugs and biological substances: Secondary | ICD-10-CM | POA: Diagnosis not present

## 2019-06-06 DIAGNOSIS — H524 Presbyopia: Secondary | ICD-10-CM | POA: Diagnosis not present

## 2019-09-25 ENCOUNTER — Other Ambulatory Visit: Payer: Self-pay | Admitting: Obstetrics & Gynecology

## 2019-09-25 DIAGNOSIS — Z1231 Encounter for screening mammogram for malignant neoplasm of breast: Secondary | ICD-10-CM

## 2019-10-03 ENCOUNTER — Other Ambulatory Visit: Payer: Self-pay

## 2019-10-03 DIAGNOSIS — Z20822 Contact with and (suspected) exposure to covid-19: Secondary | ICD-10-CM

## 2019-10-05 LAB — NOVEL CORONAVIRUS, NAA: SARS-CoV-2, NAA: NOT DETECTED

## 2019-11-06 ENCOUNTER — Ambulatory Visit
Admission: RE | Admit: 2019-11-06 | Discharge: 2019-11-06 | Disposition: A | Discharge status: Home / Self Care | Payer: No Typology Code available for payment source | Source: Ambulatory Visit | Attending: Obstetrics & Gynecology | Admitting: Obstetrics & Gynecology

## 2019-11-06 ENCOUNTER — Other Ambulatory Visit: Payer: Self-pay

## 2019-11-06 DIAGNOSIS — Z1231 Encounter for screening mammogram for malignant neoplasm of breast: Secondary | ICD-10-CM | POA: Diagnosis not present

## 2019-11-13 ENCOUNTER — Ambulatory Visit (INDEPENDENT_AMBULATORY_CARE_PROVIDER_SITE_OTHER): Payer: Managed Care, Other (non HMO) | Admitting: Obstetrics & Gynecology

## 2019-11-13 ENCOUNTER — Other Ambulatory Visit: Payer: Self-pay

## 2019-11-13 ENCOUNTER — Encounter: Payer: Self-pay | Admitting: Obstetrics & Gynecology

## 2019-11-13 VITALS — BP 150/80 | Ht 64.0 in | Wt 150.0 lb

## 2019-11-13 DIAGNOSIS — Z01419 Encounter for gynecological examination (general) (routine) without abnormal findings: Secondary | ICD-10-CM | POA: Diagnosis not present

## 2019-11-13 DIAGNOSIS — Z1211 Encounter for screening for malignant neoplasm of colon: Secondary | ICD-10-CM

## 2019-11-13 NOTE — Patient Instructions (Signed)
PAP every 5 years    Due 2024 Mammogram every year Colonoscopy every 10 years Labs yearly (with PCP)

## 2019-11-13 NOTE — Progress Notes (Signed)
HPI:      [name] is a 48 y.o. G2P1011 who LMP was Patient's last menstrual period was 10/04/2018 (exact date)., she presents today for her annual examination. The patient has no complaints today. The patient is not currently sexually active. Her last pap: approximate date 2019 and was normal (and now is s/p hysterectomy for fibroids 09/2018) and last mammogram: approximate date 10/2019 and was normal. The patient does perform self breast exams.  There is no notable family history of breast or ovarian cancer in her family.  The patient has regular exercise: yes.  The patient denies current symptoms of depression.    GYN History: Contraception: status post hysterectomy  PMHx: Past Medical History:  Diagnosis Date  . Anemia   . Breast screening, unspecified 2013  . Heart murmur    Past Surgical History:  Procedure Laterality Date  . ABDOMINAL HYSTERECTOMY    . BREAST BIOPSY Right 12/16/2016   NEG; PASH  . BREAST BIOPSY Right 06/02/2017   NEG  . BREAST EXCISIONAL BIOPSY Bilateral 2004, 2005   multiple  . BREAST SURGERY Right 12-08-02   fibrocystic  . breast tumor removal   2005  . CYSTOSCOPY  10/11/2018   Procedure: CYSTOSCOPY;  Surgeon: Gae Dry, MD;  Location: [facility] ORS;  Service: Gynecology;;  . DILATION AND CURETTAGE OF UTERUS    . LAPAROSCOPIC HYSTERECTOMY N/A 10/11/2018   Procedure: HYSTERECTOMY TOTAL LAPAROSCOPIC bilateral salpingectomy;  Surgeon: Gae Dry, MD;  Location: [facility] ORS;  Service: Gynecology;  Laterality: N/A;   Family History  Problem Relation Age of Onset  . Hypertension Father   . Breast cancer Maternal Grandmother 33  . Cancer Paternal Grandmother        colon or rectal  . Lung cancer Paternal Grandfather    Social History   Tobacco Use  . Smoking status: Never Smoker  . Smokeless tobacco: Never Used  Substance Use Topics  . Alcohol use: Yes    Comment: rare  . Drug use: No    Current Outpatient Medications:  .   lisinopril-hydrochlorothiazide (ZESTORETIC) 20-25 MG tablet, Take 1 tablet by mouth daily., Disp: , Rfl:  .  fluticasone (FLONASE) 50 MCG/ACT nasal spray, Place 1 spray into both nostrils daily. (Patient not taking: Reported on 11/13/2019), Disp: 16 g, Rfl: 2 .  hydrochlorothiazide (HYDRODIURIL) 25 MG tablet, Take by mouth., Disp: , Rfl:  .  ibuprofen (ADVIL,MOTRIN) 800 MG tablet, Take 1 tablet (800 mg total) by mouth 3 (three) times daily. (Patient not taking: Reported on 11/13/2019), Disp: 21 tablet, Rfl: 0 .  vitamin C (ASCORBIC ACID) 500 MG tablet, Take 500 mg by mouth daily. , Disp: , Rfl:  No current facility-administered medications for this visit.  Facility-Administered Medications Ordered in Other Visits:  .  0.9 %  sodium chloride infusion, , Intravenous, Continuous, Berenzon, Dmitriy, MD Allergies: Patient has no known allergies.  Review of Systems  Constitutional: Negative for chills, fever and malaise/fatigue.  HENT: Negative for congestion, sinus pain and sore throat.   Eyes: Negative for blurred vision and pain.  Respiratory: Negative for cough and wheezing.   Cardiovascular: Negative for chest pain and leg swelling.  Gastrointestinal: Negative for abdominal pain, constipation, diarrhea, heartburn, nausea and vomiting.  Genitourinary: Negative for dysuria, frequency, hematuria and urgency.  Musculoskeletal: Negative for back pain, joint pain, myalgias and neck pain.  Skin: Negative for itching and rash.  Neurological: Negative for dizziness, tremors and weakness.  Endo/Heme/Allergies: Does not bruise/bleed easily.  Psychiatric/Behavioral: Negative for depression. The patient is not nervous/anxious and does not have insomnia.     Objective: BP (!) 150/80   Ht 5\' 4"  (1.626 m)   Wt 150 lb (68 kg)   LMP 10/04/2018 (Exact Date)   BMI 25.75 kg/m   Filed Weights   11/13/19 1535  Weight: 150 lb (68 kg)   Body mass index is 25.75 kg/m. Physical Exam Constitutional:       General: She is not in acute distress.    Appearance: She is well-developed.  Genitourinary:     Pelvic exam was performed with patient supine.     Vagina and rectum normal.     No lesions in the vagina.     No vaginal bleeding.     No right or left adnexal mass present.     Right adnexa not tender.     Left adnexa not tender.     Genitourinary Comments: Absent Uterus Absent cervix Vaginal cuff well healed  HENT:     Head: Normocephalic and atraumatic. No laceration.     Right Ear: Hearing normal.     Left Ear: Hearing normal.     Mouth/Throat:     Pharynx: Uvula midline.  Eyes:     Pupils: Pupils are equal, round, and reactive to light.  Neck:     Thyroid: No thyromegaly.  Cardiovascular:     Rate and Rhythm: Normal rate and regular rhythm.     Heart sounds: No murmur. No friction rub. No gallop.   Pulmonary:     Effort: Pulmonary effort is normal. No respiratory distress.     Breath sounds: Normal breath sounds. No wheezing.  Chest:     Breasts:        Right: No mass, skin change or tenderness.        Left: No mass, skin change or tenderness.  Abdominal:     General: Bowel sounds are normal. There is no distension.     Palpations: Abdomen is soft.     Tenderness: There is no abdominal tenderness. There is no rebound.  Musculoskeletal:        General: Normal range of motion.     Cervical back: Normal range of motion and neck supple.  Neurological:     Mental Status: She is alert and oriented to person, place, and time.     Cranial Nerves: No cranial nerve deficit.  Skin:    General: Skin is warm and dry.  Psychiatric:        Judgment: Judgment normal.  Vitals reviewed.     Assessment:  ANNUAL EXAM 1. Women's annual routine gynecological examination   2. Screen for colon cancer      Screening Plan:            1.  Cervical Screening-  Pap smear schedule reviewed with patient, due every 5 years (2024)  2. Breast screening- Exam annually and mammogram>40  planned   3. Colonoscopy every 10 years, Hemoccult testing - after age 31  4. Labs managed by PCP  5. Counseling for contraception: none needed    F/U  Return in about 1 year (around 11/12/2020) for Annual.  Barnett Applebaum, MD, [facility], [facility] 11/13/2019  4:06 PM

## 2019-11-21 ENCOUNTER — Other Ambulatory Visit: Payer: Self-pay

## 2019-11-21 ENCOUNTER — Telehealth: Payer: Self-pay

## 2019-11-21 DIAGNOSIS — Z1211 Encounter for screening for malignant neoplasm of colon: Secondary | ICD-10-CM

## 2019-11-21 MED ORDER — GOLYTELY 236 G PO SOLR: 4000.0000 mL | Freq: Once | ORAL | 0 refills | Status: AC

## 2019-11-21 NOTE — Telephone Encounter (Signed)
Gastroenterology Pre-Procedure Review   PATIENT REVIEW QUESTIONS: The patient responded to the following health history questions as indicated:    1. Are you having any GI issues? no 2. Do you have a personal history of Polyps? no 3. Do you have a family history of Colon Cancer or Polyps? no 4. Diabetes Mellitus? no 5. Joint replacements in the past 12 months?no 6. Major health problems in the past 3 months?no 7. Any artificial heart valves, MVP, or defibrillator?no    MEDICATIONS & ALLERGIES:    Patient reports the following regarding taking any anticoagulation/antiplatelet therapy:   Plavix, Coumadin, Eliquis, Xarelto, Lovenox, Pradaxa, Brilinta, or Effient? no Aspirin? no  Patient confirms/reports the following medications:  Current Outpatient Medications  Medication Sig Dispense Refill  . fluticasone (FLONASE) 50 MCG/ACT nasal spray Place 1 spray into both nostrils daily. (Patient not taking: Reported on 11/13/2019) 16 g 2  . hydrochlorothiazide (HYDRODIURIL) 25 MG tablet Take by mouth.    Marland Kitchen ibuprofen (ADVIL,MOTRIN) 800 MG tablet Take 1 tablet (800 mg total) by mouth 3 (three) times daily. (Patient not taking: Reported on 11/13/2019) 21 tablet 0  . lisinopril-hydrochlorothiazide (ZESTORETIC) 20-25 MG tablet Take 1 tablet by mouth daily.    . polyethylene glycol (GOLYTELY) 236 g solution Take 4,000 mLs by mouth once for 1 dose. 4000 mL 0  . vitamin C (ASCORBIC ACID) 500 MG tablet Take 500 mg by mouth daily.      No current facility-administered medications for this visit.   Facility-Administered Medications Ordered in Other Visits  Medication Dose Route Frequency Provider Last Rate Last Admin  . 0.9 %  sodium chloride infusion   Intravenous Continuous Berenzon, Dmitriy, MD        Patient confirms/reports the following allergies:  No Known Allergies  Orders Placed This Encounter  Procedures  . Ambulatory referral to Gastroenterology    Referral Priority:   Routine   Referral Type:   Consultation    Referral Reason:   Specialty Services Required    Referred to Provider:   Lucilla Lame, MD    Number of Visits Requested:   1    AUTHORIZATION INFORMATION Primary Insurance: 1D#: Group #:  Secondary Insurance: 1D#: Group #:  SCHEDULE INFORMATION: Date: 12/18/2019 Time: Location:[place]

## 2019-12-11 ENCOUNTER — Other Ambulatory Visit: Payer: Self-pay

## 2019-12-11 ENCOUNTER — Encounter: Payer: Self-pay | Admitting: Gastroenterology

## 2019-12-14 ENCOUNTER — Other Ambulatory Visit
Admission: RE | Admit: 2019-12-14 | Discharge: 2019-12-14 | Disposition: A | Discharge status: Home / Self Care | Payer: No Typology Code available for payment source | Source: Ambulatory Visit | Attending: Gastroenterology | Admitting: Gastroenterology

## 2019-12-14 ENCOUNTER — Other Ambulatory Visit: Payer: Self-pay

## 2019-12-14 DIAGNOSIS — Z01812 Encounter for preprocedural laboratory examination: Secondary | ICD-10-CM | POA: Insufficient documentation

## 2019-12-14 DIAGNOSIS — Z20822 Contact with and (suspected) exposure to covid-19: Secondary | ICD-10-CM | POA: Diagnosis not present

## 2019-12-14 LAB — SARS CORONAVIRUS 2 (TAT 6-24 HRS): SARS Coronavirus 2: NEGATIVE

## 2019-12-15 NOTE — Discharge Instructions (Signed)
General Anesthesia, Adult, Care After This sheet gives you information about how to care for yourself after your procedure. Your health care provider may also give you more specific instructions. If you have problems or questions, contact your health care provider. What can I expect after the procedure? After the procedure, the following side effects are common:  Pain or discomfort at the IV site.  Nausea.  Vomiting.  Sore throat.  Trouble concentrating.  Feeling cold or chills.  Weak or tired.  Sleepiness and fatigue.  Soreness and body aches. These side effects can affect parts of the body that were not involved in surgery. Follow these instructions at home:  For at least 24 hours after the procedure:  Have a responsible adult stay with you. It is important to have someone help care for you until you are awake and alert.  Rest as needed.  Do not: ? Participate in activities in which you could fall or become injured. ? Drive. ? Use heavy machinery. ? Drink alcohol. ? Take sleeping pills or medicines that cause drowsiness. ? Make important decisions or sign legal documents. ? Take care of children on your own. Eating and drinking  Follow any instructions from your health care provider about eating or drinking restrictions.  When you feel hungry, start by eating small amounts of foods that are soft and easy to digest (bland), such as toast. Gradually return to your regular diet.  Drink enough fluid to keep your urine pale yellow.  If you vomit, rehydrate by drinking water, juice, or clear broth. General instructions  If you have sleep apnea, surgery and certain medicines can increase your risk for breathing problems. Follow instructions from your health care provider about wearing your sleep device: ? Anytime you are sleeping, including during daytime naps. ? While taking prescription pain medicines, sleeping medicines, or medicines that make you drowsy.  Return to  your normal activities as told by your health care provider. Ask your health care provider what activities are safe for you.  Take over-the-counter and prescription medicines only as told by your health care provider.  If you smoke, do not smoke without supervision.  Keep all follow-up visits as told by your health care provider. This is important. Contact a health care provider if:  You have nausea or vomiting that does not get better with medicine.  You cannot eat or drink without vomiting.  You have pain that does not get better with medicine.  You are unable to pass urine.  You develop a skin rash.  You have a fever.  You have redness around your IV site that gets worse. Get help right away if:  You have difficulty breathing.  You have chest pain.  You have blood in your urine or stool, or you vomit blood. Summary  After the procedure, it is common to have a sore throat or nausea. It is also common to feel tired.  Have a responsible adult stay with you for the first 24 hours after general anesthesia. It is important to have someone help care for you until you are awake and alert.  When you feel hungry, start by eating small amounts of foods that are soft and easy to digest (bland), such as toast. Gradually return to your regular diet.  Drink enough fluid to keep your urine pale yellow.  Return to your normal activities as told by your health care provider. Ask your health care provider what activities are safe for you. This information is not   intended to replace advice given to you by your health care provider. Make sure you discuss any questions you have with your health care provider. Document Revised: 11/05/2017 Document Reviewed: 06/18/2017 Elsevier Patient Education  2020 [employer] Inc.  

## 2019-12-18 ENCOUNTER — Encounter: Payer: Self-pay | Admitting: Gastroenterology

## 2019-12-18 ENCOUNTER — Ambulatory Visit: Payer: 59 | Admitting: Anesthesiology

## 2019-12-18 ENCOUNTER — Encounter
Admission: RE | Disposition: A | Discharge status: Home / Self Care | Payer: Self-pay | Source: Home / Self Care | Attending: Gastroenterology

## 2019-12-18 ENCOUNTER — Other Ambulatory Visit: Payer: Self-pay

## 2019-12-18 ENCOUNTER — Ambulatory Visit
Admission: RE | Admit: 2019-12-18 | Discharge: 2019-12-18 | Disposition: A | Discharge status: Home / Self Care | Payer: 59 | Attending: Gastroenterology | Admitting: Gastroenterology

## 2019-12-18 DIAGNOSIS — Z90710 Acquired absence of both cervix and uterus: Secondary | ICD-10-CM | POA: Insufficient documentation

## 2019-12-18 DIAGNOSIS — Z800 Family history of malignant neoplasm of digestive organs: Secondary | ICD-10-CM | POA: Diagnosis not present

## 2019-12-18 DIAGNOSIS — Z8249 Family history of ischemic heart disease and other diseases of the circulatory system: Secondary | ICD-10-CM | POA: Insufficient documentation

## 2019-12-18 DIAGNOSIS — Z803 Family history of malignant neoplasm of breast: Secondary | ICD-10-CM | POA: Insufficient documentation

## 2019-12-18 DIAGNOSIS — Z801 Family history of malignant neoplasm of trachea, bronchus and lung: Secondary | ICD-10-CM | POA: Diagnosis not present

## 2019-12-18 DIAGNOSIS — R011 Cardiac murmur, unspecified: Secondary | ICD-10-CM | POA: Insufficient documentation

## 2019-12-18 DIAGNOSIS — I10 Essential (primary) hypertension: Secondary | ICD-10-CM | POA: Insufficient documentation

## 2019-12-18 DIAGNOSIS — Z1211 Encounter for screening for malignant neoplasm of colon: Secondary | ICD-10-CM | POA: Insufficient documentation

## 2019-12-18 HISTORY — PX: COLONOSCOPY WITH PROPOFOL: SHX5780

## 2019-12-18 HISTORY — DX: Essential (primary) hypertension: I10

## 2019-12-18 SURGERY — COLONOSCOPY WITH PROPOFOL
Anesthesia: General

## 2019-12-18 MED ORDER — ACETAMINOPHEN 325 MG PO TABS: 325.0000 mg | ORAL_TABLET | Freq: Once | ORAL | Status: DC

## 2019-12-18 MED ORDER — LACTATED RINGERS IV SOLN
10.0000 mL/h | INTRAVENOUS | Status: DC
  Administered 2019-12-18: 10 mL/h via INTRAVENOUS

## 2019-12-18 MED ORDER — ACETAMINOPHEN 160 MG/5ML PO SOLN: 325.0000 mg | Freq: Once | ORAL | Status: DC

## 2019-12-18 MED ORDER — PROPOFOL 10 MG/ML IV BOLUS
INTRAVENOUS | Status: DC | PRN
  Administered 2019-12-18: 50 mg via INTRAVENOUS
  Administered 2019-12-18: 100 mg via INTRAVENOUS
  Administered 2019-12-18 (×2): 50 mg via INTRAVENOUS

## 2019-12-18 MED ORDER — STERILE WATER FOR IRRIGATION IR SOLN
Status: DC | PRN
  Administered 2019-12-18: 09:00:00 150 mL

## 2019-12-18 MED ORDER — LIDOCAINE HCL (CARDIAC) PF 100 MG/5ML IV SOSY
PREFILLED_SYRINGE | INTRAVENOUS | Status: DC | PRN
  Administered 2019-12-18: 50 mg via INTRAVENOUS

## 2019-12-18 SURGICAL SUPPLY — 5 items
CANISTER SUCT 1200ML W/VALVE (MISCELLANEOUS) ×3 IMPLANT
GOWN CVR UNV OPN BCK APRN NK (MISCELLANEOUS) ×2 IMPLANT
GOWN ISOL THUMB LOOP REG UNIV (MISCELLANEOUS) ×4
KIT ENDO PROCEDURE OLY (KITS) ×3 IMPLANT
WATER STERILE IRR 250ML POUR (IV SOLUTION) ×3 IMPLANT

## 2019-12-18 NOTE — Anesthesia Preprocedure Evaluation (Signed)
Anesthesia Evaluation  Patient identified by MRN, date of birth, ID band Patient awake    Reviewed: Allergy & Precautions, H&P , NPO status , Patient's Chart, lab work & pertinent test results  Airway Mallampati: II  TM Distance: >3 FB Neck ROM: full    Dental no notable dental hx.    Pulmonary    Pulmonary exam normal breath sounds clear to auscultation       Cardiovascular hypertension, Normal cardiovascular exam Rhythm:regular Rate:Normal     Neuro/Psych    GI/Hepatic   Endo/Other    Renal/GU      Musculoskeletal   Abdominal   Peds  Hematology   Anesthesia Other Findings   Reproductive/Obstetrics                             Anesthesia Physical Anesthesia Plan  ASA: II  Anesthesia Plan: General   Post-op Pain Management:    Induction: Intravenous  PONV Risk Score and Plan: 3 and Treatment may vary due to age or medical condition, TIVA and Propofol infusion  Airway Management Planned: Natural Airway  Additional Equipment:   Intra-op Plan:   Post-operative Plan:   Informed Consent: I have reviewed the patients History and Physical, chart, labs and discussed the procedure including the risks, benefits and alternatives for the proposed anesthesia with the patient or authorized representative who has indicated his/her understanding and acceptance.     Dental Advisory Given  Plan Discussed with: CRNA  Anesthesia Plan Comments:         Anesthesia Quick Evaluation

## 2019-12-18 NOTE — Anesthesia Postprocedure Evaluation (Signed)
Anesthesia Post Note  Patient: Priscilla Fernandez  Procedure(s) Performed: COLONOSCOPY WITH PROPOFOL (N/A )     Patient location during evaluation: PACU Anesthesia Type: General Level of consciousness: awake and alert and oriented Pain management: satisfactory to patient Vital Signs Assessment: post-procedure vital signs reviewed and stable Respiratory status: spontaneous breathing, nonlabored ventilation and respiratory function stable Cardiovascular status: blood pressure returned to baseline and stable Postop Assessment: Adequate PO intake and No signs of nausea or vomiting Anesthetic complications: no    Raliegh Ip

## 2019-12-18 NOTE — Transfer of Care (Signed)
Immediate Anesthesia Transfer of Care Note  Patient: Priscilla Fernandez  Procedure(s) Performed: COLONOSCOPY WITH PROPOFOL (N/A )  Patient Location: PACU  Anesthesia Type: General  Level of Consciousness: awake, alert  and patient cooperative  Airway and Oxygen Therapy: Patient Spontanous Breathing and Patient connected to supplemental oxygen  Post-op Assessment: Post-op Vital signs reviewed, Patient's Cardiovascular Status Stable, Respiratory Function Stable, Patent Airway and No signs of Nausea or vomiting  Post-op Vital Signs: Reviewed and stable  Complications: No apparent anesthesia complications

## 2019-12-18 NOTE — H&P (Signed)
Priscilla Bellows, MD [address], [place], [place], [place], [address] [id number], [place], [place], [place], [address] Phone: [phone]  Fax: [phone]  Primary Care Physician:  Dianne Dun, MD   Pre-Procedure History & Physical: HPI:  Priscilla Fernandez is a 49 y.o. female is here for an colonoscopy.   Past Medical History:  Diagnosis Date  . Anemia   . Breast screening, unspecified 2013  . Heart murmur   . Hypertension     Past Surgical History:  Procedure Laterality Date  . ABDOMINAL HYSTERECTOMY    . BREAST BIOPSY Right 12/16/2016   NEG; PASH  . BREAST BIOPSY Right 06/02/2017   NEG  . BREAST EXCISIONAL BIOPSY Bilateral 2004, 2005   multiple  . BREAST SURGERY Right 12-08-02   fibrocystic  . breast tumor removal   2005  . CYSTOSCOPY  10/11/2018   Procedure: CYSTOSCOPY;  Surgeon: Gae Dry, MD;  Location: [facility];  Service: Gynecology;;  . DILATION AND CURETTAGE OF UTERUS    . LAPAROSCOPIC HYSTERECTOMY N/A 10/11/2018   Procedure: HYSTERECTOMY TOTAL LAPAROSCOPIC bilateral salpingectomy;  Surgeon: Gae Dry, MD;  Location: [facility];  Service: Gynecology;  Laterality: N/A;    Prior to Admission medications   Medication Sig Start Date End Date Taking? Authorizing Provider  lisinopril-hydrochlorothiazide (ZESTORETIC) 20-25 MG tablet Take 1 tablet by mouth daily. 07/03/19  Yes [provider]  vitamin C (ASCORBIC ACID) 500 MG tablet Take 500 mg by mouth daily.    Yes [provider]  fluticasone (FLONASE) 50 MCG/ACT nasal spray Place 1 spray into both nostrils daily. Patient not taking: Reported on 11/13/2019 05/14/18   Zigmund Gottron, NP    Allergies as of 11/21/2019  . (No Known Allergies)    Family History  Problem Relation Age of Onset  . Hypertension Father   . Breast cancer Maternal Grandmother 21  . Cancer Paternal Grandmother        colon or rectal  . Lung cancer Paternal Grandfather     Social History    Socioeconomic History  . Marital status: Single    Spouse name: Not on file  . Number of children: Not on file  . Years of education: Not on file  . Highest education level: Not on file  Occupational History  . Not on file  Tobacco Use  . Smoking status: Never Smoker  . Smokeless tobacco: Never Used  Substance and Sexual Activity  . Alcohol use: Yes    Alcohol/week: 1.0 standard drinks    Types: 1 Cans of beer per week    Comment: rare  . Drug use: No  . Sexual activity: Yes    Birth control/protection: None  Other Topics Concern  . Not on file  Social History Narrative  . Not on file   Social Determinants of Health   Financial Resource Strain:   . Difficulty of Paying Living Expenses: Not on file  Food Insecurity:   . Worried About Charity fundraiser in the Last Year: Not on file  . Ran Out of Food in the Last Year: Not on file  Transportation Needs:   . Lack of Transportation (Medical): Not on file  . Lack of Transportation (Non-Medical): Not on file  Physical Activity:   . Days of Exercise per Week: Not on file  . Minutes of Exercise per Session: Not on file  Stress:   . Feeling of Stress : Not on file  Social Connections:   .  Frequency of Communication with Friends and Family: Not on file  . Frequency of Social Gatherings with Friends and Family: Not on file  . Attends Religious Services: Not on file  . Active Member of Clubs or Organizations: Not on file  . Attends Archivist Meetings: Not on file  . Marital Status: Not on file  Intimate Partner Violence:   . Fear of Current or Ex-Partner: Not on file  . Emotionally Abused: Not on file  . Physically Abused: Not on file  . Sexually Abused: Not on file    Review of Systems: See HPI, otherwise negative ROS  Physical Exam: BP (!) 156/85   Pulse 66   Temp 98.1 F (36.7 C) (Temporal)   Resp 16   Ht 5\' 3"  (1.6 m)   Wt 68 kg   LMP 10/04/2018 (Exact Date)   SpO2 100%   BMI 26.57 kg/m   General:   Alert,  pleasant and cooperative in NAD Head:  Normocephalic and atraumatic. Neck:  Supple; no masses or thyromegaly. Lungs:  Clear throughout to auscultation, normal respiratory effort.    Heart:  +S1, +S2, Regular rate and rhythm, No edema. Abdomen:  Soft, nontender and nondistended. Normal bowel sounds, without guarding, and without rebound.   Neurologic:  Alert and  oriented x4;  grossly normal neurologically.  Impression/Plan: [name] is here for an colonoscopy to be performed for Screening colonoscopy average risk   Risks, benefits, limitations, and alternatives regarding  colonoscopy have been reviewed with the patient.  Questions have been answered.  All parties agreeable.   Priscilla Bellows, MD  12/18/2019, 8:07 AM

## 2019-12-18 NOTE — Anesthesia Procedure Notes (Signed)
Procedure Name: MAC Performed by: Roxas Clymer M, CRNA Pre-anesthesia Checklist: Patient identified, Emergency Drugs available, Suction available, Patient being monitored and Timeout performed Patient Re-evaluated:Patient Re-evaluated prior to induction Oxygen Delivery Method: Nasal cannula       

## 2019-12-18 NOTE — Op Note (Signed)
[facility] Gastroenterology Patient Name: Priscilla Fernandez Procedure Date: 12/18/2019 8:39 AM MRN: [id number] Account #: [id number] Date of Birth: 1971-02-03 Admit Type: Outpatient Age: 49 Room: [facility] OR ROOM 01 Gender: Female Note Status: Finalized Procedure:             Colonoscopy Indications:           Screening for colorectal malignant neoplasm Providers:             Jonathon Bellows MD, MD Medicines:             Monitored Anesthesia Care Complications:         No immediate complications. Procedure:             Pre-Anesthesia Assessment:                        - Prior to the procedure, a History and Physical was                         performed, and patient medications, allergies and                         sensitivities were reviewed. The patient's tolerance                         of previous anesthesia was reviewed.                        - The risks and benefits of the procedure and the                         sedation options and risks were discussed with the                         patient. All questions were answered and informed                         consent was obtained.                        - ASA Grade Assessment: II - A patient with mild                         systemic disease.                        After obtaining informed consent, the colonoscope was                         passed under direct vision. Throughout the procedure,                         the patient's blood pressure, pulse, and oxygen                         saturations were monitored continuously. The was                         introduced through the anus and advanced to the the  cecum, identified by the appendiceal orifice. The                         colonoscopy was performed with ease. The patient                         tolerated the procedure well. The quality of the bowel                         preparation was good. Findings:      The perianal and  digital rectal examinations were normal.      The entire examined colon appeared normal on direct and retroflexion       views. Impression:            - The entire examined colon is normal on direct and                         retroflexion views.                        - No specimens collected. Recommendation:        - Discharge patient to home (with escort).                        - Resume previous diet.                        - Continue present medications.                        - Repeat colonoscopy in 10 years for screening                         purposes. Procedure Code(s):     --- Professional ---                        [phone], Colonoscopy, flexible; diagnostic, including                         collection of specimen(s) by brushing or washing, when                         performed (separate procedure) Diagnosis Code(s):     --- Professional ---                        Z12.11, Encounter for screening for malignant neoplasm                         of colon CPT copyright 2019 American Medical Association. All rights reserved. The codes documented in this report are preliminary and upon coder review may  be revised to meet current compliance requirements. Jonathon Bellows, MD Jonathon Bellows MD, MD 12/18/2019 9:02:04 AM This report has been signed electronically. Number of Addenda: 0 Note Initiated On: 12/18/2019 8:39 AM Scope Withdrawal Time: 0 hours 11 minutes 22 seconds  Total Procedure Duration: 0 hours 14 minutes 17 seconds  Estimated Blood Loss:  Estimated blood loss: none.      [facility]

## 2019-12-19 ENCOUNTER — Encounter: Payer: Self-pay | Admitting: *Deleted

## 2020-06-11 DIAGNOSIS — H524 Presbyopia: Secondary | ICD-10-CM | POA: Diagnosis not present

## 2020-06-19 ENCOUNTER — Other Ambulatory Visit: Payer: Self-pay

## 2020-06-19 ENCOUNTER — Ambulatory Visit
Admission: EM | Admit: 2020-06-19 | Discharge: 2020-06-19 | Disposition: A | Discharge status: Home / Self Care | Payer: 59 | Attending: Internal Medicine | Admitting: Internal Medicine

## 2020-06-19 DIAGNOSIS — Z90710 Acquired absence of both cervix and uterus: Secondary | ICD-10-CM | POA: Diagnosis not present

## 2020-06-19 DIAGNOSIS — B9789 Other viral agents as the cause of diseases classified elsewhere: Secondary | ICD-10-CM | POA: Insufficient documentation

## 2020-06-19 DIAGNOSIS — D500 Iron deficiency anemia secondary to blood loss (chronic): Secondary | ICD-10-CM | POA: Diagnosis not present

## 2020-06-19 DIAGNOSIS — J028 Acute pharyngitis due to other specified organisms: Secondary | ICD-10-CM | POA: Insufficient documentation

## 2020-06-19 DIAGNOSIS — Z20822 Contact with and (suspected) exposure to covid-19: Secondary | ICD-10-CM | POA: Diagnosis not present

## 2020-06-19 DIAGNOSIS — Z8249 Family history of ischemic heart disease and other diseases of the circulatory system: Secondary | ICD-10-CM | POA: Insufficient documentation

## 2020-06-19 DIAGNOSIS — Z79899 Other long term (current) drug therapy: Secondary | ICD-10-CM | POA: Insufficient documentation

## 2020-06-19 DIAGNOSIS — I10 Essential (primary) hypertension: Secondary | ICD-10-CM | POA: Insufficient documentation

## 2020-06-19 DIAGNOSIS — Z791 Long term (current) use of non-steroidal anti-inflammatories (NSAID): Secondary | ICD-10-CM | POA: Diagnosis not present

## 2020-06-19 DIAGNOSIS — J029 Acute pharyngitis, unspecified: Secondary | ICD-10-CM

## 2020-06-19 MED ORDER — BENZONATATE 100 MG PO CAPS: 100.0000 mg | ORAL_CAPSULE | Freq: Three times a day (TID) | ORAL | 0 refills | Status: DC

## 2020-06-19 MED ORDER — IBUPROFEN 600 MG PO TABS: 600.0000 mg | ORAL_TABLET | Freq: Four times a day (QID) | ORAL | 0 refills | Status: DC | PRN

## 2020-06-19 MED ORDER — GUAIFENESIN 400 MG PO TABS: 400.0000 mg | ORAL_TABLET | ORAL | 0 refills | Status: DC

## 2020-06-19 NOTE — ED Triage Notes (Signed)
Patient complains of cough and runny nose x 2 days. Patient states that she has been fatigued slightly and has noticed some sinus pressure. States that she has been fully vaccinated.

## 2020-06-19 NOTE — ED Provider Notes (Signed)
[facility]    CSN: [id number] Arrival date & time: 06/19/20  1355      History   Chief Complaint Chief Complaint  Patient presents with  . Cough    HPI Priscilla Fernandez is a 49 y.o. female fully vaccinated against COVID-19 virus comes to urgent care with complaints of 2-day history of sore throat, nonproductive cough, generalized body aches and headaches.  Patient complains of some fatigue but she is also complaining of being very tired from excessive workload recently.  Patient denies any vomiting or diarrhea.  She admits to having sick contacts from nieces/nephews who have upper respiratory infection symptoms and otitis media.  No loss of taste or smell.  No nausea vomiting or diarrhea.  No wheezing. HPI  Past Medical History:  Diagnosis Date  . Anemia   . Breast screening, unspecified 2013  . Heart murmur   . Hypertension     Patient Active Problem List   Diagnosis Date Noted  . History of hysterectomy 12/23/2018  . Pelvic pain 09/20/2018  . Iron deficiency anemia due to chronic blood loss 11/14/2015  . Fibrocystic breast disease 01/18/2014  . Family history of breast cancer 01/18/2014    Past Surgical History:  Procedure Laterality Date  . ABDOMINAL HYSTERECTOMY    . BREAST BIOPSY Right 12/16/2016   NEG; PASH  . BREAST BIOPSY Right 06/02/2017   NEG  . BREAST EXCISIONAL BIOPSY Bilateral 2004, 2005   multiple  . BREAST SURGERY Right 12-08-02   fibrocystic  . breast tumor removal   2005  . COLONOSCOPY WITH PROPOFOL N/A 12/18/2019   Procedure: COLONOSCOPY WITH PROPOFOL;  Surgeon: Jonathon Bellows, MD;  Location: [facility];  Service: Endoscopy;  Laterality: N/A;  . CYSTOSCOPY  10/11/2018   Procedure: CYSTOSCOPY;  Surgeon: Gae Dry, MD;  Location: [facility];  Service: Gynecology;;  . DILATION AND CURETTAGE OF UTERUS    . LAPAROSCOPIC HYSTERECTOMY N/A 10/11/2018   Procedure: HYSTERECTOMY TOTAL LAPAROSCOPIC bilateral salpingectomy;  Surgeon:  Gae Dry, MD;  Location: [facility];  Service: Gynecology;  Laterality: N/A;    OB History    Gravida  2   Para  1   Term  1   Preterm      AB  1   Living  1     SAB  1   TAB      Ectopic      Multiple      Live Births           Obstetric Comments  First pregnancy 1 Age first period 20         Home Medications    Prior to Admission medications   Medication Sig Start Date End Date Taking? Authorizing Provider  lisinopril-hydrochlorothiazide (ZESTORETIC) 20-25 MG tablet Take 1 tablet by mouth daily. 07/03/19  Yes [provider]  benzonatate (TESSALON) 100 MG capsule Take 1 capsule (100 mg total) by mouth every 8 (eight) hours. 06/19/20   Jonella Redditt, Myrene Galas, MD  guaifenesin (HUMIBID E) 400 MG TABS tablet Take 1 tablet (400 mg total) by mouth every 4 (four) hours. 06/19/20   Trysta Showman, Myrene Galas, MD  ibuprofen (ADVIL) 600 MG tablet Take 1 tablet (600 mg total) by mouth every 6 (six) hours as needed. 06/19/20   Chase Picket, MD  fluticasone (FLONASE) 50 MCG/ACT nasal spray Place 1 spray into both nostrils daily. Patient not taking: Reported on 11/13/2019 05/14/18 06/19/20  Zigmund Gottron, NP  Family History Family History  Problem Relation Age of Onset  . Hypertension Father   . Breast cancer Maternal Grandmother 55  . Cancer Paternal Grandmother        colon or rectal  . Lung cancer Paternal Grandfather     Social History Social History   Tobacco Use  . Smoking status: Never Smoker  . Smokeless tobacco: Never Used  Vaping Use  . Vaping Use: Never used  Substance Use Topics  . Alcohol use: Yes    Alcohol/week: 1.0 standard drink    Types: 1 Cans of beer per week    Comment: rare  . Drug use: No     Allergies   Patient has no known allergies.   Review of Systems Review of Systems  Constitutional: Positive for activity change and fatigue. Negative for chills and fever.  HENT: Positive for congestion, sinus pressure, sinus pain  and sore throat.   Respiratory: Positive for cough. Negative for shortness of breath and wheezing.   Cardiovascular: Negative for chest pain.  Gastrointestinal: Negative.  Negative for diarrhea, nausea and vomiting.  Genitourinary: Negative.   Neurological: Positive for headaches. Negative for dizziness and light-headedness.     Physical Exam Triage Vital Signs ED Triage Vitals  Enc Vitals Group     BP 06/19/20 1411 (!) 158/90     Pulse Rate 06/19/20 1411 62     Resp 06/19/20 1411 18     Temp 06/19/20 1411 98.5 F (36.9 C)     Temp Source 06/19/20 1411 Oral     SpO2 06/19/20 1411 100 %     Weight 06/19/20 1407 140 lb (63.5 kg)     Height 06/19/20 1407 5\' 3"  (1.6 m)     Head Circumference --      Peak Flow --      Pain Score 06/19/20 1407 6     Pain Loc --      Pain Edu? --      Excl. in [place]? --    No data found.  Updated Vital Signs BP (!) 158/90 (BP Location: Right Arm)   Pulse 62   Temp 98.5 F (36.9 C) (Oral)   Resp 18   Ht 5\' 3"  (1.6 m)   Wt 63.5 kg   LMP 10/04/2018 (Exact Date)   SpO2 100%   BMI 24.80 kg/m   Visual Acuity Right Eye Distance:   Left Eye Distance:   Bilateral Distance:    Right Eye Near:   Left Eye Near:    Bilateral Near:     Physical Exam Vitals and nursing note reviewed.  Constitutional:      Appearance: She is ill-appearing.  HENT:     Right Ear: Tympanic membrane normal.     Left Ear: Tympanic membrane normal.     Nose: Nose normal. No rhinorrhea.     Mouth/Throat:     Mouth: Mucous membranes are moist.     Pharynx: Posterior oropharyngeal erythema present. No oropharyngeal exudate.  Cardiovascular:     Rate and Rhythm: Regular rhythm.     Pulses: Normal pulses.     Heart sounds: Normal heart sounds.  Pulmonary:     Effort: Pulmonary effort is normal.     Breath sounds: Normal breath sounds.  Abdominal:     General: Bowel sounds are normal.     Palpations: Abdomen is soft.  Musculoskeletal:        General: Normal range  of motion.  Neurological:  Mental Status: She is alert.      UC Treatments / Results  Labs (all labs ordered are listed, but only abnormal results are displayed) Labs Reviewed  SARS CORONAVIRUS 2 (TAT 6-24 HRS)    EKG   Radiology No results found.  Procedures Procedures (including critical care time)  Medications Ordered in UC Medications - No data to display  Initial Impression / Assessment and Plan / UC Course  I have reviewed the triage vital signs and the nursing notes.  Pertinent labs & imaging results that were available during my care of the patient were reviewed by me and considered in my medical decision making (see chart for details).     1.  Acute viral pharyngitis: Tessalon Perles as needed for cough Ibuprofen as needed for body aches and/or fever Humibid as needed for sputum production COVID-19 PCR test has been sent Patient is advised to check her lab results on MyChart. Return precautions given. Final Clinical Impressions(s) / UC Diagnoses   Final diagnoses:  Acute viral pharyngitis   Discharge Instructions   None    ED Prescriptions    Medication Sig Dispense Auth. Provider   benzonatate (TESSALON) 100 MG capsule Take 1 capsule (100 mg total) by mouth every 8 (eight) hours. 30 capsule Sontee Desena, Myrene Galas, MD   guaifenesin (HUMIBID E) 400 MG TABS tablet Take 1 tablet (400 mg total) by mouth every 4 (four) hours. 84 tablet Savior Himebaugh, Myrene Galas, MD   ibuprofen (ADVIL) 600 MG tablet Take 1 tablet (600 mg total) by mouth every 6 (six) hours as needed. 30 tablet Kalley Nicholl, Myrene Galas, MD     PDMP not reviewed this encounter.   Chase Picket, MD 06/19/20 [phone]

## 2020-06-20 LAB — SARS CORONAVIRUS 2 (TAT 6-24 HRS): SARS Coronavirus 2: NEGATIVE

## 2020-11-13 ENCOUNTER — Other Ambulatory Visit (HOSPITAL_COMMUNITY)
Admission: RE | Admit: 2020-11-13 | Discharge: 2020-11-13 | Disposition: A | Discharge status: Home / Self Care | Payer: 59 | Source: Ambulatory Visit | Attending: Obstetrics & Gynecology | Admitting: Obstetrics & Gynecology

## 2020-11-13 ENCOUNTER — Ambulatory Visit (INDEPENDENT_AMBULATORY_CARE_PROVIDER_SITE_OTHER): Payer: 59 | Admitting: Obstetrics & Gynecology

## 2020-11-13 ENCOUNTER — Encounter: Payer: Self-pay | Admitting: Obstetrics & Gynecology

## 2020-11-13 ENCOUNTER — Other Ambulatory Visit: Payer: Self-pay

## 2020-11-13 VITALS — BP 120/80 | Ht 64.0 in | Wt 153.0 lb

## 2020-11-13 DIAGNOSIS — Z13220 Encounter for screening for lipoid disorders: Secondary | ICD-10-CM | POA: Diagnosis not present

## 2020-11-13 DIAGNOSIS — Z90710 Acquired absence of both cervix and uterus: Secondary | ICD-10-CM | POA: Diagnosis not present

## 2020-11-13 DIAGNOSIS — Z1329 Encounter for screening for other suspected endocrine disorder: Secondary | ICD-10-CM | POA: Diagnosis not present

## 2020-11-13 DIAGNOSIS — Z131 Encounter for screening for diabetes mellitus: Secondary | ICD-10-CM | POA: Diagnosis not present

## 2020-11-13 DIAGNOSIS — Z1272 Encounter for screening for malignant neoplasm of vagina: Secondary | ICD-10-CM | POA: Diagnosis not present

## 2020-11-13 DIAGNOSIS — Z1321 Encounter for screening for nutritional disorder: Secondary | ICD-10-CM | POA: Diagnosis not present

## 2020-11-13 DIAGNOSIS — Z01419 Encounter for gynecological examination (general) (routine) without abnormal findings: Secondary | ICD-10-CM | POA: Diagnosis not present

## 2020-11-13 DIAGNOSIS — Z1231 Encounter for screening mammogram for malignant neoplasm of breast: Secondary | ICD-10-CM

## 2020-11-13 NOTE — Patient Instructions (Signed)
PAP every 5 years Mammogram every year    Call [phone] to schedule at [facility] Colonoscopy every 10 years Labs yearly (with PCP)  Thank you for choosing [facility]. As part of our ongoing efforts to improve patient experience, we would appreciate your feedback. Please fill out the short survey that you will receive by mail or MyChart. Your opinion is important to [place]! - Dr. Tiburcio Pea  Recommendations to boost your immunity to prevent illness such as viral flu and colds, including covid19, are as follows: Vitamin K2 and Vitamin D3. Take Vitamin K2 at 200-300 mcg daily (usually 2-3 pills daily of the over the counter formulation). Take Vitamin D3 at 3000-4000 U daily (usually 3-4 pills daily of the over the counter formulation). Studies show that these two at high normal levels in your system are very effective in keeping your immunity so strong and protective that you will be unlikely to contract viral illness such as those listed above.  Dr Tiburcio Pea

## 2020-11-13 NOTE — Progress Notes (Signed)
HPI:      [name] is a 49 y.o. G2P1011 who LMP was in the past, she presents today for her annual examination.  The patient has no complaints today. The patient is sexually active. Herlast pap: was normal and last mammogram: approximate date 2020 and was normal.  She has had a hysterectomy (No PAP since, vag PAP discussed).  The patient does perform self breast exams.  There is no notable family history of breast or ovarian cancer in her family. The patient is not taking hormone replacement therapy. Patient denies post-menopausal vaginal bleeding.   The patient has regular exercise: yes. The patient denies current symptoms of depression.    GYN Hx: Last Colonoscopy:1 year ago. Normal.  Last DEXA: never ago.    PMHx: Past Medical History:  Diagnosis Date  . Anemia   . Breast screening, unspecified 2013  . Heart murmur   . Hypertension    Past Surgical History:  Procedure Laterality Date  . ABDOMINAL HYSTERECTOMY    . BREAST BIOPSY Right 12/16/2016   NEG; PASH  . BREAST BIOPSY Right 06/02/2017   NEG  . BREAST EXCISIONAL BIOPSY Bilateral 2004, 2005   multiple  . BREAST SURGERY Right 12-08-02   fibrocystic  . breast tumor removal   2005  . COLONOSCOPY WITH PROPOFOL N/A 12/18/2019   Procedure: COLONOSCOPY WITH PROPOFOL;  Surgeon: Jonathon Bellows, MD;  Location: [facility];  Service: Endoscopy;  Laterality: N/A;  . CYSTOSCOPY  10/11/2018   Procedure: CYSTOSCOPY;  Surgeon: Gae Dry, MD;  Location: [facility];  Service: Gynecology;;  . DILATION AND CURETTAGE OF UTERUS    . LAPAROSCOPIC HYSTERECTOMY N/A 10/11/2018   Procedure: HYSTERECTOMY TOTAL LAPAROSCOPIC bilateral salpingectomy;  Surgeon: Gae Dry, MD;  Location: [facility];  Service: Gynecology;  Laterality: N/A;   Family History  Problem Relation Age of Onset  . Hypertension Father   . Breast cancer Maternal Grandmother 65  . Cancer Paternal Grandmother        colon or rectal  . Lung cancer  Paternal Grandfather    Social History   Tobacco Use  . Smoking status: Never Smoker  . Smokeless tobacco: Never Used  Vaping Use  . Vaping Use: Never used  Substance Use Topics  . Alcohol use: Yes    Alcohol/week: 1.0 standard drink    Types: 1 Cans of beer per week    Comment: rare  . Drug use: No    Current Outpatient Medications:  .  benzonatate (TESSALON) 100 MG capsule, Take 1 capsule (100 mg total) by mouth every 8 (eight) hours., Disp: 30 capsule, Rfl: 0 .  guaifenesin (HUMIBID E) 400 MG TABS tablet, Take 1 tablet (400 mg total) by mouth every 4 (four) hours., Disp: 84 tablet, Rfl: 0 .  ibuprofen (ADVIL) 600 MG tablet, Take 1 tablet (600 mg total) by mouth every 6 (six) hours as needed., Disp: 30 tablet, Rfl: 0 .  lisinopril-hydrochlorothiazide (ZESTORETIC) 20-25 MG tablet, Take 1 tablet by mouth daily., Disp: , Rfl:  No current facility-administered medications for this visit.  Facility-Administered Medications Ordered in Other Visits:  .  0.9 %  sodium chloride infusion, , Intravenous, Continuous, Berenzon, Dmitriy, MD Allergies: Patient has no known allergies.  Review of Systems  Constitutional: Negative for chills, fever and malaise/fatigue.  HENT: Negative for congestion, sinus pain and sore throat.   Eyes: Negative for blurred vision and pain.  Respiratory: Negative for cough and wheezing.   Cardiovascular: Negative for  chest pain and leg swelling.  Gastrointestinal: Negative for abdominal pain, constipation, diarrhea, heartburn, nausea and vomiting.  Genitourinary: Negative for dysuria, frequency, hematuria and urgency.  Musculoskeletal: Negative for back pain, joint pain, myalgias and neck pain.  Skin: Negative for itching and rash.  Neurological: Negative for dizziness, tremors and weakness.  Endo/Heme/Allergies: Does not bruise/bleed easily.  Psychiatric/Behavioral: Negative for depression. The patient is not nervous/anxious and does not have insomnia.      Objective: BP 120/80   Ht 5\' 4"  (1.626 m)   Wt 153 lb (69.4 kg)   LMP 10/04/2018 (Exact Date)   BMI 26.26 kg/m   Filed Weights   11/13/20 1418  Weight: 153 lb (69.4 kg)   Body mass index is 26.26 kg/m. Physical Exam Constitutional:      General: She is not in acute distress.    Appearance: She is well-developed.  Genitourinary:     Vulva, urethra, bladder, vagina and rectum normal.     No lesions in the vagina.     Genitourinary Comments: Vaginal cuff well healed     No vaginal bleeding.      Right Adnexa: not tender and no mass present.    Left Adnexa: not tender and no mass present.    Cervix is absent.     Uterus is absent.     Pelvic exam was performed with patient supine.  Breasts:     Right: No mass, skin change or tenderness.     Left: No mass, skin change or tenderness.    HENT:     Head: Normocephalic and atraumatic. No laceration.     Right Ear: Hearing normal.     Left Ear: Hearing normal.     Mouth/Throat:     Pharynx: Uvula midline.  Eyes:     Pupils: Pupils are equal, round, and reactive to light.  Neck:     Thyroid: No thyromegaly.  Cardiovascular:     Rate and Rhythm: Normal rate and regular rhythm.     Heart sounds: No murmur heard. No friction rub. No gallop.   Pulmonary:     Effort: Pulmonary effort is normal. No respiratory distress.     Breath sounds: Normal breath sounds. No wheezing.  Abdominal:     General: Bowel sounds are normal. There is no distension.     Palpations: Abdomen is soft.     Tenderness: There is no abdominal tenderness. There is no rebound.  Musculoskeletal:        General: Normal range of motion.     Cervical back: Normal range of motion and neck supple.  Neurological:     Mental Status: She is alert and oriented to person, place, and time.     Cranial Nerves: No cranial nerve deficit.  Skin:    General: Skin is warm and dry.  Psychiatric:        Judgment: Judgment normal.  Vitals reviewed.      Assessment: Annual Exam 1. Women's annual routine gynecological examination   2. History of hysterectomy   3. Screening for vaginal cancer   4. Encounter for screening mammogram for malignant neoplasm of breast     Plan:            1.  Cervical Screening-  Pap smear done today  2. Breast screening- Exam annually and mammogram scheduled  3. Colonoscopy every 10 years, Hemoccult testing after age 31  4. Labs To return fasting at a later date  5. Counseling for hormonal therapy:  no change in therapy today (none needed)              6. FRAX - FRAX score for assessing the 10 year probability for fracture calculated and discussed today.  Based on age and score today, DEXA is not currently scheduled.    F/U  Return in about 1 year (around 11/13/2021) for Annual.  Barnett Applebaum, MD, [facility], [facility] 11/13/2020  2:31 PM

## 2020-11-18 ENCOUNTER — Other Ambulatory Visit: Payer: Self-pay

## 2020-11-18 ENCOUNTER — Ambulatory Visit
Admission: RE | Admit: 2020-11-18 | Discharge: 2020-11-18 | Disposition: A | Discharge status: Home / Self Care | Payer: 59 | Source: Ambulatory Visit | Attending: Obstetrics & Gynecology | Admitting: Obstetrics & Gynecology

## 2020-11-18 ENCOUNTER — Other Ambulatory Visit: Payer: Self-pay | Admitting: Obstetrics & Gynecology

## 2020-11-18 ENCOUNTER — Other Ambulatory Visit: Payer: 59

## 2020-11-18 DIAGNOSIS — Z131 Encounter for screening for diabetes mellitus: Secondary | ICD-10-CM

## 2020-11-18 DIAGNOSIS — Z1321 Encounter for screening for nutritional disorder: Secondary | ICD-10-CM

## 2020-11-18 DIAGNOSIS — Z1329 Encounter for screening for other suspected endocrine disorder: Secondary | ICD-10-CM | POA: Diagnosis not present

## 2020-11-18 DIAGNOSIS — N6489 Other specified disorders of breast: Secondary | ICD-10-CM

## 2020-11-18 DIAGNOSIS — Z1231 Encounter for screening mammogram for malignant neoplasm of breast: Secondary | ICD-10-CM | POA: Insufficient documentation

## 2020-11-18 DIAGNOSIS — Z13220 Encounter for screening for lipoid disorders: Secondary | ICD-10-CM | POA: Diagnosis not present

## 2020-11-18 DIAGNOSIS — R928 Other abnormal and inconclusive findings on diagnostic imaging of breast: Secondary | ICD-10-CM

## 2020-11-18 LAB — CYTOLOGY - PAP: Diagnosis: NEGATIVE

## 2020-11-19 LAB — VITAMIN D 25 HYDROXY (VIT D DEFICIENCY, FRACTURES): Vit D, 25-Hydroxy: 16.6 ng/mL — ABNORMAL LOW (ref 30.0–100.0)

## 2020-11-19 LAB — GLUCOSE, FASTING: Glucose, Plasma: 78 mg/dL (ref 65–99)

## 2020-11-19 LAB — LIPID PANEL
Chol/HDL Ratio: 2.4 ratio (ref 0.0–4.4)
Cholesterol, Total: 146 mg/dL (ref 100–199)
HDL: 60 mg/dL (ref >39)
LDL Chol Calc (NIH): 70 mg/dL (ref 0–99)
Triglycerides: 82 mg/dL (ref 0–149)
VLDL Cholesterol Cal: 16 mg/dL (ref 5–40)

## 2020-11-19 LAB — TSH: TSH: 2.52 u[IU]/mL (ref 0.450–4.500)

## 2020-11-21 ENCOUNTER — Telehealth: Payer: Self-pay | Admitting: Obstetrics & Gynecology

## 2020-11-21 NOTE — Telephone Encounter (Signed)
Patient is calling to follow up on Mammogram results. Please advise. Patient aware RPH is in office she patient's.

## 2020-11-26 ENCOUNTER — Other Ambulatory Visit: Payer: Self-pay

## 2020-11-26 ENCOUNTER — Ambulatory Visit
Admission: RE | Admit: 2020-11-26 | Discharge: 2020-11-26 | Disposition: A | Discharge status: Home / Self Care | Payer: 59 | Source: Ambulatory Visit | Attending: Obstetrics & Gynecology | Admitting: Obstetrics & Gynecology

## 2020-11-26 DIAGNOSIS — R928 Other abnormal and inconclusive findings on diagnostic imaging of breast: Secondary | ICD-10-CM | POA: Insufficient documentation

## 2020-11-26 DIAGNOSIS — N6489 Other specified disorders of breast: Secondary | ICD-10-CM

## 2020-11-26 DIAGNOSIS — R922 Inconclusive mammogram: Secondary | ICD-10-CM | POA: Diagnosis not present

## 2021-01-09 ENCOUNTER — Ambulatory Visit
Admission: EM | Admit: 2021-01-09 | Discharge: 2021-01-09 | Disposition: A | Discharge status: Home / Self Care | Payer: 59 | Attending: Sports Medicine | Admitting: Sports Medicine

## 2021-01-09 ENCOUNTER — Encounter: Payer: Self-pay | Admitting: Emergency Medicine

## 2021-01-09 ENCOUNTER — Other Ambulatory Visit: Payer: Self-pay

## 2021-01-09 DIAGNOSIS — R059 Cough, unspecified: Secondary | ICD-10-CM | POA: Diagnosis not present

## 2021-01-09 DIAGNOSIS — U071 COVID-19: Secondary | ICD-10-CM | POA: Diagnosis not present

## 2021-01-09 DIAGNOSIS — B349 Viral infection, unspecified: Secondary | ICD-10-CM

## 2021-01-09 DIAGNOSIS — R0981 Nasal congestion: Secondary | ICD-10-CM | POA: Diagnosis not present

## 2021-01-09 MED ORDER — PSEUDOEPH-BROMPHEN-DM 30-2-10 MG/5ML PO SYRP: 10.0000 mL | ORAL_SOLUTION | Freq: Four times a day (QID) | ORAL | 0 refills | Status: AC | PRN

## 2021-01-09 MED ORDER — IPRATROPIUM BROMIDE 0.06 % NA SOLN: 2.0000 | Freq: Four times a day (QID) | NASAL | 12 refills | Status: DC

## 2021-01-09 NOTE — ED Provider Notes (Signed)
[facility]    CSN: [id number] Arrival date & time: 01/09/21  1532      History   Chief Complaint Chief Complaint  Patient presents with  . Cough    HPI Priscilla Fernandez is a 50 y.o. female presenting for 2-3-day history of fatigue, cough, nasal congestion, chills and sweats.  Patient also states that at night she feels like she is wheezing.  Denies any recorded fever.  Admits to mild headaches.  Denies any sore throat, chest pain or breathing difficulty.  No nausea/vomiting or diarrhea.  Patient denies any sick contacts.  No known exposure to COVID-19.  Patient fully vaccinated for COVID-19.  Patient has no history of cardiopulmonary disease.  Past medical history significant for hypertension.  Patient has taken over-the-counter Mucinex without improvement in symptoms.  No other concerns.  HPI  Past Medical History:  Diagnosis Date  . Anemia   . Breast screening, unspecified 2013  . Heart murmur   . Hypertension     Patient Active Problem List   Diagnosis Date Noted  . History of hysterectomy 12/23/2018  . Pelvic pain 09/20/2018  . Iron deficiency anemia due to chronic blood loss 11/14/2015  . Fibrocystic breast disease 01/18/2014  . Family history of breast cancer 01/18/2014    Past Surgical History:  Procedure Laterality Date  . ABDOMINAL HYSTERECTOMY    . BREAST BIOPSY Right 12/16/2016   NEG; PASH  . BREAST BIOPSY Right 06/02/2017   NEG  . BREAST EXCISIONAL BIOPSY Bilateral 2004, 2005   multiple  . BREAST SURGERY Right 12-08-02   fibrocystic  . breast tumor removal   2005  . COLONOSCOPY WITH PROPOFOL N/A 12/18/2019   Procedure: COLONOSCOPY WITH PROPOFOL;  Surgeon: Jonathon Bellows, MD;  Location: [place];  Service: Endoscopy;  Laterality: N/A;  . CYSTOSCOPY  10/11/2018   Procedure: CYSTOSCOPY;  Surgeon: Gae Dry, MD;  Location: [facility];  Service: Gynecology;;  . DILATION AND CURETTAGE OF UTERUS    . LAPAROSCOPIC HYSTERECTOMY N/A  10/11/2018   Procedure: HYSTERECTOMY TOTAL LAPAROSCOPIC bilateral salpingectomy;  Surgeon: Gae Dry, MD;  Location: [facility];  Service: Gynecology;  Laterality: N/A;    OB History    Gravida  2   Para  1   Term  1   Preterm      AB  1   Living  1     SAB  1   IAB      Ectopic      Multiple      Live Births           Obstetric Comments  First pregnancy 62 Age first period 60         Home Medications    Prior to Admission medications   Medication Sig Start Date End Date Taking? Authorizing Provider  brompheniramine-pseudoephedrine-DM 30-2-10 MG/5ML syrup Take 10 mLs by mouth 4 (four) times daily as needed for up to 7 days. 01/09/21 01/16/21 Yes Laurene Footman B, PA-C  ipratropium (ATROVENT) 0.06 % nasal spray Place 2 sprays into both nostrils 4 (four) times daily. 01/09/21  Yes Danton Clap, PA-C  lisinopril-hydrochlorothiazide (ZESTORETIC) 20-25 MG tablet Take 1 tablet by mouth daily. 07/03/19  Yes [provider]  benzonatate (TESSALON) 100 MG capsule Take 1 capsule (100 mg total) by mouth every 8 (eight) hours. 06/19/20   Lamptey, Myrene Galas, MD  guaifenesin (HUMIBID E) 400 MG TABS tablet Take 1 tablet (400 mg total) by mouth every 4 (  four) hours. 06/19/20   Lamptey, Myrene Galas, MD  ibuprofen (ADVIL) 600 MG tablet Take 1 tablet (600 mg total) by mouth every 6 (six) hours as needed. 06/19/20   Chase Picket, MD  fluticasone (FLONASE) 50 MCG/ACT nasal spray Place 1 spray into both nostrils daily. Patient not taking: Reported on 11/13/2019 05/14/18 06/19/20  Zigmund Gottron, NP    Family History Family History  Problem Relation Age of Onset  . Hypertension Father   . Breast cancer Maternal Grandmother 62  . Cancer Paternal Grandmother        colon or rectal  . Lung cancer Paternal Grandfather     Social History Social History   Tobacco Use  . Smoking status: Never Smoker  . Smokeless tobacco: Never Used  Vaping Use  . Vaping Use: Never used   Substance Use Topics  . Alcohol use: Yes    Alcohol/week: 1.0 standard drink    Types: 1 Cans of beer per week    Comment: rare  . Drug use: No     Allergies   Patient has no known allergies.   Review of Systems Review of Systems  Constitutional: Positive for chills. Negative for diaphoresis, fatigue and fever.  HENT: Positive for congestion and rhinorrhea. Negative for ear pain, sinus pressure, sinus pain and sore throat.   Respiratory: Positive for cough and wheezing. Negative for shortness of breath.   Gastrointestinal: Negative for abdominal pain, nausea and vomiting.  Musculoskeletal: Negative for arthralgias and myalgias.  Skin: Negative for rash.  Neurological: Positive for headaches. Negative for weakness.  Hematological: Negative for adenopathy.     Physical Exam Triage Vital Signs ED Triage Vitals  Enc Vitals Group     BP 01/09/21 1549 (!) 160/92     Pulse Rate 01/09/21 1549 61     Resp 01/09/21 1549 18     Temp 01/09/21 1549 98.7 F (37.1 C)     Temp Source 01/09/21 1549 Oral     SpO2 01/09/21 1549 100 %     Weight 01/09/21 1547 153 lb (69.4 kg)     Height 01/09/21 1547 5\' 4"  (1.626 m)     Head Circumference --      Peak Flow --      Pain Score 01/09/21 1547 0     Pain Loc --      Pain Edu? --      Excl. in [place]? --    No data found.  Updated Vital Signs BP (!) 160/92 (BP Location: Left Arm)   Pulse 61   Temp 98.7 F (37.1 C) (Oral)   Resp 18   Ht 5\' 4"  (1.626 m)   Wt 153 lb (69.4 kg)   LMP 10/04/2018 (Exact Date)   SpO2 100%   BMI 26.26 kg/m       Physical Exam Vitals and nursing note reviewed.  Constitutional:      General: She is not in acute distress.    Appearance: Normal appearance. She is not ill-appearing or toxic-appearing.  HENT:     Head: Normocephalic and atraumatic.     Nose: Congestion and rhinorrhea present.     Mouth/Throat:     Mouth: Mucous membranes are moist.     Pharynx: Oropharynx is clear.  Eyes:     General:  No scleral icterus.       Right eye: No discharge.        Left eye: No discharge.     Conjunctiva/sclera: Conjunctivae normal.  Cardiovascular:     Rate and Rhythm: Normal rate and regular rhythm.     Heart sounds: Normal heart sounds.  Pulmonary:     Effort: Pulmonary effort is normal. No respiratory distress.     Breath sounds: Normal breath sounds. No wheezing, rhonchi or rales.  Musculoskeletal:     Cervical back: Neck supple.  Skin:    General: Skin is dry.  Neurological:     General: No focal deficit present.     Mental Status: She is alert. Mental status is at baseline.     Motor: No weakness.     Gait: Gait normal.  Psychiatric:        Mood and Affect: Mood normal.        Behavior: Behavior normal.        Thought Content: Thought content normal.      UC Treatments / Results  Labs (all labs ordered are listed, but only abnormal results are displayed) Labs Reviewed  SARS CORONAVIRUS 2 (TAT 6-24 HRS)    EKG   Radiology No results found.  Procedures Procedures (including critical care time)  Medications Ordered in UC Medications - No data to display  Initial Impression / Assessment and Plan / UC Course  I have reviewed the triage vital signs and the nursing notes.  Pertinent labs & imaging results that were available during my care of the patient were reviewed by me and considered in my medical decision making (see chart for details).   50 year old female with 2 to 3-day history of cough, congestion, fatigue and headaches.  Her blood pressure is elevated 160/92 in the clinic.  Other vital signs are stable and normal.  Oxygen is 100%.  She is in no acute distress and is overall well-appearing.  Exam significant for mild nasal congestion and clear rhinorrhea.  Her chest is clear to auscultation heart regular rate and rhythm.  Suspect viral illness, possibly COVID-19.  Current CDC guidelines, isolation protocol and ED precautions reviewed patient.  Sent Bromfed  and Atrovent nasal spray to pharmacy.  Advised for her to rest and increase fluids.  Advised for her to return or go to ED if she develops a fever, chest pain, breathing difficulty or any worsening symptoms. Work note provided.   Final Clinical Impressions(s) / UC Diagnoses   Final diagnoses:  Viral illness  Cough  Nasal congestion     Discharge Instructions     URI/COLD SYMPTOMS: Your exam today is consistent with a viral illness. Antibiotics are not indicated at this time. Use medications as directed, including cough syrup, nasal saline, and decongestants. Your symptoms should improve over the next few days and resolve within 7-10 days. Increase rest and fluids. F/u if symptoms worsen or predominate such as sore throat, ear pain, productive cough, shortness of breath, or if you develop high fevers or worsening fatigue over the next several days.    You have received COVID testing today either for positive exposure, concerning symptoms that could be related to COVID infection, screening purposes, or re-testing after confirmed positive.  Your test obtained today checks for active viral infection in the last 1-2 weeks. If your test is negative now, you can still test positive later. So, if you do develop symptoms you should either get re-tested and/or isolate x 5 days and then strict mask use x 5 days (unvaccinated) or mask use x 10 days (vaccinated). Please follow CDC guidelines.  While Rapid antigen tests come back in 15-20 minutes, send out PCR/molecular  test results typically come back within 1-3 days. In the mean time, if you are symptomatic, assume this could be a positive test and treat/monitor yourself as if you do have COVID.   We will call with test results if positive. Please download the MyChart app and set up a profile to access test results.   If symptomatic, go home and rest. Push fluids. Take Tylenol as needed for discomfort. Gargle warm salt water. Throat lozenges. Take Mucinex  DM or Robitussin for cough. Humidifier in bedroom to ease coughing. Warm showers. Also review the COVID handout for more information.  COVID-19 INFECTION: The incubation period of COVID-19 is approximately 14 days after exposure, with most symptoms developing in roughly 4-5 days. Symptoms may range in severity from mild to critically severe. Roughly 80% of those infected will have mild symptoms. People of any age may become infected with COVID-19 and have the ability to transmit the virus. The most common symptoms include: fever, fatigue, cough, body aches, headaches, sore throat, nasal congestion, shortness of breath, nausea, vomiting, diarrhea, changes in smell and/or taste.    COURSE OF ILLNESS Some patients may begin with mild disease which can progress quickly into critical symptoms. If your symptoms are worsening please call ahead to the Emergency Department and proceed there for further treatment. Recovery time appears to be roughly 1-2 weeks for mild symptoms and 3-6 weeks for severe disease.   GO IMMEDIATELY TO ER FOR FEVER YOU ARE UNABLE TO GET DOWN WITH TYLENOL, BREATHING PROBLEMS, CHEST PAIN, FATIGUE, LETHARGY, INABILITY TO EAT OR DRINK, ETC  QUARANTINE AND ISOLATION: To help decrease the spread of COVID-19 please remain isolated if you have COVID infection or are highly suspected to have COVID infection. This means -stay home and isolate to one room in the home if you live with others. Do not share a bed or bathroom with others while ill, sanitize and wipe down all countertops and keep common areas clean and disinfected. Stay home for 5 days. If you have no symptoms or your symptoms are resolving after 5 days, you can leave your house. Continue to wear a mask around others for 5 additional days. If you have been in close contact (within 6 feet) of someone diagnosed with COVID 19, you are advised to quarantine in your home for 14 days as symptoms can develop anywhere from 2-14 days after  exposure to the virus. If you develop symptoms, you  must isolate.  Most current guidelines for COVID after exposure -unvaccinated: isolate 5 days and strict mask use x 5 days. Test on day 5 is possible -vaccinated: wear mask x 10 days if symptoms do not develop -You do not necessarily need to be tested for COVID if you have + exposure and  develop symptoms. Just isolate at home x10 days from symptom onset During this global pandemic, CDC advises to practice social distancing, try to stay at least 53ft away from others at all times. Wear a face covering. Wash and sanitize your hands regularly and avoid going anywhere that is not necessary.  KEEP IN MIND THAT THE COVID TEST IS NOT 100% ACCURATE AND YOU SHOULD STILL DO EVERYTHING TO PREVENT POTENTIAL SPREAD OF VIRUS TO OTHERS (WEAR MASK, WEAR GLOVES, [place] HANDS AND SANITIZE REGULARLY). IF INITIAL TEST IS NEGATIVE, THIS MAY NOT MEAN YOU ARE DEFINITELY NEGATIVE. MOST ACCURATE TESTING IS DONE 5-7 DAYS AFTER EXPOSURE.   It is not advised by CDC to get re-tested after receiving a positive COVID test since you  can still test positive for weeks to months after you have already cleared the virus.   *If you have not been vaccinated for COVID, I strongly suggest you consider getting vaccinated as long as there are no contraindications.      ED Prescriptions    Medication Sig Dispense Auth. Provider   brompheniramine-pseudoephedrine-DM 30-2-10 MG/5ML syrup Take 10 mLs by mouth 4 (four) times daily as needed for up to 7 days. 150 mL Laurene Footman B, PA-C   ipratropium (ATROVENT) 0.06 % nasal spray Place 2 sprays into both nostrils 4 (four) times daily. 15 mL Danton Clap, PA-C     PDMP not reviewed this encounter.   Danton Clap, PA-C 01/09/21 [phone]

## 2021-01-09 NOTE — ED Triage Notes (Signed)
Pt c/o cough, nasal congestion, chills and wheezing. Started about 3 days ago. Denies fever. She has had covid vaccines.

## 2021-01-09 NOTE — Discharge Instructions (Addendum)

## 2021-01-10 LAB — SARS CORONAVIRUS 2 (TAT 6-24 HRS): SARS Coronavirus 2: POSITIVE — AB

## 2021-01-11 ENCOUNTER — Telehealth (HOSPITAL_COMMUNITY): Payer: Self-pay | Admitting: Family

## 2021-01-11 NOTE — Telephone Encounter (Signed)
Called to discuss with Misty Stanley about Covid symptoms and the use of casirivimab/imdevimab, a combination monoclonal antibody infusion for those with mild to moderate Covid symptoms and at a high risk of hospitalization.     Pt is qualified for this infusion at the infusion center due to co-morbid conditions and/or a member of an at-risk group, however declines infusion at this time. Symptoms tier reviewed as well as criteria for ending isolation. Pt states she feels much better, is keeping busy, and cleaning her house, just waiting to be able to go back to work. She states she has been advised of quarantine and when to seek help.       Patient Active Problem List   Diagnosis Date Noted  . History of hysterectomy 12/23/2018  . Pelvic pain 09/20/2018  . Iron deficiency anemia due to chronic blood loss 11/14/2015  . Fibrocystic breast disease 01/18/2014  . Family history of breast cancer 01/18/2014    Chyrel Taha,NP

## 2021-03-19 ENCOUNTER — Other Ambulatory Visit: Payer: Self-pay | Admitting: Obstetrics & Gynecology

## 2021-03-19 DIAGNOSIS — N6489 Other specified disorders of breast: Secondary | ICD-10-CM

## 2021-05-27 ENCOUNTER — Ambulatory Visit
Admission: RE | Admit: 2021-05-27 | Discharge: 2021-05-27 | Disposition: A | Discharge status: Home / Self Care | Payer: 59 | Source: Ambulatory Visit | Attending: Obstetrics & Gynecology | Admitting: Obstetrics & Gynecology

## 2021-05-27 ENCOUNTER — Other Ambulatory Visit: Payer: Self-pay

## 2021-05-27 DIAGNOSIS — N6489 Other specified disorders of breast: Secondary | ICD-10-CM | POA: Insufficient documentation

## 2021-05-27 DIAGNOSIS — R922 Inconclusive mammogram: Secondary | ICD-10-CM | POA: Diagnosis not present

## 2021-07-01 DIAGNOSIS — E041 Nontoxic single thyroid nodule: Secondary | ICD-10-CM | POA: Diagnosis not present

## 2021-07-01 DIAGNOSIS — Z1159 Encounter for screening for other viral diseases: Secondary | ICD-10-CM | POA: Diagnosis not present

## 2021-07-01 DIAGNOSIS — I10 Essential (primary) hypertension: Secondary | ICD-10-CM | POA: Diagnosis not present

## 2021-07-01 DIAGNOSIS — Z23 Encounter for immunization: Secondary | ICD-10-CM | POA: Diagnosis not present

## 2021-07-01 DIAGNOSIS — Z1231 Encounter for screening mammogram for malignant neoplasm of breast: Secondary | ICD-10-CM | POA: Diagnosis not present

## 2021-07-15 DIAGNOSIS — I10 Essential (primary) hypertension: Secondary | ICD-10-CM | POA: Diagnosis not present

## 2021-07-15 DIAGNOSIS — E041 Nontoxic single thyroid nodule: Secondary | ICD-10-CM | POA: Diagnosis not present

## 2021-07-23 DIAGNOSIS — E042 Nontoxic multinodular goiter: Secondary | ICD-10-CM | POA: Diagnosis not present

## 2021-07-23 DIAGNOSIS — E041 Nontoxic single thyroid nodule: Secondary | ICD-10-CM | POA: Diagnosis not present

## 2021-07-25 DIAGNOSIS — E041 Nontoxic single thyroid nodule: Secondary | ICD-10-CM | POA: Diagnosis not present

## 2021-07-25 DIAGNOSIS — Z0000 Encounter for general adult medical examination without abnormal findings: Secondary | ICD-10-CM | POA: Diagnosis not present

## 2021-07-25 DIAGNOSIS — E042 Nontoxic multinodular goiter: Secondary | ICD-10-CM | POA: Diagnosis not present

## 2021-07-25 DIAGNOSIS — I10 Essential (primary) hypertension: Secondary | ICD-10-CM | POA: Diagnosis not present

## 2021-07-25 DIAGNOSIS — Z79899 Other long term (current) drug therapy: Secondary | ICD-10-CM | POA: Diagnosis not present

## 2021-07-25 DIAGNOSIS — D509 Iron deficiency anemia, unspecified: Secondary | ICD-10-CM | POA: Diagnosis not present

## 2021-08-29 DIAGNOSIS — E041 Nontoxic single thyroid nodule: Secondary | ICD-10-CM | POA: Diagnosis not present

## 2021-09-30 DIAGNOSIS — Z0000 Encounter for general adult medical examination without abnormal findings: Secondary | ICD-10-CM | POA: Diagnosis not present

## 2021-09-30 DIAGNOSIS — I10 Essential (primary) hypertension: Secondary | ICD-10-CM | POA: Diagnosis not present

## 2021-10-07 DIAGNOSIS — M8580 Other specified disorders of bone density and structure, unspecified site: Secondary | ICD-10-CM | POA: Diagnosis not present

## 2021-11-14 ENCOUNTER — Other Ambulatory Visit: Payer: Self-pay

## 2021-11-14 ENCOUNTER — Ambulatory Visit (INDEPENDENT_AMBULATORY_CARE_PROVIDER_SITE_OTHER): Payer: 59 | Admitting: Obstetrics & Gynecology

## 2021-11-14 ENCOUNTER — Encounter: Payer: Self-pay | Admitting: Obstetrics & Gynecology

## 2021-11-14 VITALS — BP 120/80 | Ht 63.0 in | Wt 151.0 lb

## 2021-11-14 DIAGNOSIS — Z1231 Encounter for screening mammogram for malignant neoplasm of breast: Secondary | ICD-10-CM

## 2021-11-14 DIAGNOSIS — R928 Other abnormal and inconclusive findings on diagnostic imaging of breast: Secondary | ICD-10-CM

## 2021-11-14 DIAGNOSIS — Z01419 Encounter for gynecological examination (general) (routine) without abnormal findings: Secondary | ICD-10-CM | POA: Diagnosis not present

## 2021-11-14 DIAGNOSIS — Z90710 Acquired absence of both cervix and uterus: Secondary | ICD-10-CM

## 2021-11-14 NOTE — Progress Notes (Signed)
HPI:      [name] is a 50 y.o. G2P1011 who LMP was in the past, s/p hysterectomy 2019, she presents today for her annual examination.  The patient has no complaints today, just occas hot flashes. The patient is sexually active. Herlast pap: approximate date 2021 and was normal and last mammogram: approximate date 2022 and was abnormal: some right sided asymmetry  w f/u mmg normal, needs repeating next month .  The patient does perform self breast exams.  There is no notable family history of breast or ovarian cancer in her family. The patient is not taking hormone replacement therapy. Patient denies post-menopausal vaginal bleeding.   The patient has regular exercise: yes. The patient denies current symptoms of depression.    GYN Hx: Last Colonoscopy:2 years ago. Normal.  PMHx: Past Medical History:  Diagnosis Date   Anemia    Breast screening, unspecified 2013   Heart murmur    Hypertension    Past Surgical History:  Procedure Laterality Date   ABDOMINAL HYSTERECTOMY     BREAST BIOPSY Right 12/16/2016   NEG; PASH   BREAST BIOPSY Right 06/02/2017   NEG   BREAST EXCISIONAL BIOPSY Bilateral 2004, 2005   multiple   BREAST SURGERY Right 12-08-02   fibrocystic   breast tumor removal   2005   COLONOSCOPY WITH PROPOFOL N/A 12/18/2019   Procedure: COLONOSCOPY WITH PROPOFOL;  Surgeon: Jonathon Bellows, MD;  Location: [facility];  Service: Endoscopy;  Laterality: N/A;   CYSTOSCOPY  10/11/2018   Procedure: CYSTOSCOPY;  Surgeon: Gae Dry, MD;  Location: [facility];  Service: Gynecology;;   DILATION AND CURETTAGE OF UTERUS     LAPAROSCOPIC HYSTERECTOMY N/A 10/11/2018   Procedure: HYSTERECTOMY TOTAL LAPAROSCOPIC bilateral salpingectomy;  Surgeon: Gae Dry, MD;  Location: [facility];  Service: Gynecology;  Laterality: N/A;   Family History  Problem Relation Age of Onset   Hypertension Father    Breast cancer Maternal Grandmother 58   Cancer Paternal Grandmother         colon or rectal   Lung cancer Paternal Grandfather    Social History   Tobacco Use   Smoking status: Never   Smokeless tobacco: Never  Vaping Use   Vaping Use: Never used  Substance Use Topics   Alcohol use: Yes    Alcohol/week: 1.0 standard drink    Types: 1 Cans of beer per week    Comment: rare   Drug use: No    Current Outpatient Medications:    chlorthalidone (HYGROTON) 25 MG tablet, Take 25 mg by mouth daily., Disp: , Rfl:    lisinopril-hydrochlorothiazide (ZESTORETIC) 20-25 MG tablet, Take 1 tablet by mouth daily., Disp: , Rfl:    benzonatate (TESSALON) 100 MG capsule, Take 1 capsule (100 mg total) by mouth every 8 (eight) hours. (Patient not taking: Reported on 11/14/2021), Disp: 30 capsule, Rfl: 0   guaifenesin (HUMIBID E) 400 MG TABS tablet, Take 1 tablet (400 mg total) by mouth every 4 (four) hours. (Patient not taking: Reported on 11/14/2021), Disp: 84 tablet, Rfl: 0   ibuprofen (ADVIL) 600 MG tablet, Take 1 tablet (600 mg total) by mouth every 6 (six) hours as needed. (Patient not taking: Reported on 11/14/2021), Disp: 30 tablet, Rfl: 0   ipratropium (ATROVENT) 0.06 % nasal spray, Place 2 sprays into both nostrils 4 (four) times daily. (Patient not taking: Reported on 11/14/2021), Disp: 15 mL, Rfl: 12 No current facility-administered medications for this visit.  Facility-Administered Medications  Ordered in Other Visits:    0.9 %  sodium chloride infusion, , Intravenous, Continuous, Berenzon, Dmitriy, MD Allergies: Patient has no known allergies.  Review of Systems  Constitutional:  Negative for chills, fever and malaise/fatigue.  HENT:  Negative for congestion, sinus pain and sore throat.   Eyes:  Negative for blurred vision and pain.  Respiratory:  Negative for cough and wheezing.   Cardiovascular:  Negative for chest pain and leg swelling.  Gastrointestinal:  Negative for abdominal pain, constipation, diarrhea, heartburn, nausea and vomiting.   Genitourinary:  Negative for dysuria, frequency, hematuria and urgency.  Musculoskeletal:  Negative for back pain, joint pain, myalgias and neck pain.  Skin:  Negative for itching and rash.  Neurological:  Negative for dizziness, tremors and weakness.  Endo/Heme/Allergies:  Does not bruise/bleed easily.  Psychiatric/Behavioral:  Negative for depression. The patient is not nervous/anxious and does not have insomnia.    Objective: BP 120/80    Ht 5\' 3"  (1.6 m)    Wt 151 lb (68.5 kg)    LMP 10/04/2018 (Exact Date)    BMI 26.75 kg/m   Filed Weights   11/14/21 1417  Weight: 151 lb (68.5 kg)   Body mass index is 26.75 kg/m. Physical Exam Constitutional:      General: She is not in acute distress.    Appearance: She is well-developed.  Genitourinary:     Vulva, bladder, rectum and urethral meatus normal.     No lesions in the vagina.     Genitourinary Comments: Vaginal cuff well healed     Right Labia: No rash, tenderness or lesions.    Left Labia: No tenderness, lesions or rash.    No vaginal bleeding.      Right Adnexa: not tender and no mass present.    Left Adnexa: not tender and no mass present.    Cervix is absent.     Uterus is absent.     Pelvic exam was performed with patient in the lithotomy position.  Breasts:    Right: No mass, skin change or tenderness.     Left: No mass, skin change or tenderness.  HENT:     Head: Normocephalic and atraumatic. No laceration.     Right Ear: Hearing normal.     Left Ear: Hearing normal.     Mouth/Throat:     Pharynx: Uvula midline.  Eyes:     Pupils: Pupils are equal, round, and reactive to light.  Neck:     Thyroid: No thyromegaly.  Cardiovascular:     Rate and Rhythm: Normal rate and regular rhythm.     Heart sounds: No murmur heard.   No friction rub. No gallop.  Pulmonary:     Effort: Pulmonary effort is normal. No respiratory distress.     Breath sounds: Normal breath sounds. No wheezing.  Abdominal:     General: Bowel  sounds are normal. There is no distension.     Palpations: Abdomen is soft.     Tenderness: There is no abdominal tenderness. There is no rebound.  Musculoskeletal:        General: Normal range of motion.     Cervical back: Normal range of motion and neck supple.  Neurological:     Mental Status: She is alert and oriented to person, place, and time.     Cranial Nerves: No cranial nerve deficit.  Skin:    General: Skin is warm and dry.  Psychiatric:        Judgment:  Judgment normal.  Vitals reviewed.    Assessment: Annual Exam 1. Women's annual routine gynecological examination   2. History of hysterectomy   3. Encounter for screening mammogram for malignant neoplasm of breast   4. Abnormal mammogram of right breast     Plan:            1.  Cervical Screening-  every 5 yrs  2. Breast screening- Exam annually and mammogram scheduled  3. Colonoscopy every 10 years, Hemoccult testing after age 29  4. Labs managed by PCP  5. Counseling for hormonal therapy: none              6. Menopase Sx's - Patient with bothersome menopausal vasomotor symptoms. Discussed lifestyle interventions such as wearing light clothing, remaining in cool environments, having fan/air conditioner in the room, avoiding hot beverages etc.  Exercise also shown to be significantly helpful in alleviating hot flashes.  Discussed using hormone therapy and concerns about increased risk of heart disease, cerebrovascular disease, thromboembolic disease,  and breast cancer.  Also discussed other medical options such as Clonidine, SSRI, or Neurontin.   Also discussed alternative therapies such as herbal remedies but cautioned that most of the products contained phytoestrogens (plant estrogens) in unregulated amounts which can have the same effects on the body as the pharmaceutical estrogen preparations.  Patient opted for [facility] therapy for now. She will re-evaluate in 2 months    F/U  Return in about 1 year (around  11/14/2022) for Annual.  Barnett Applebaum, MD, [facility], [facility] 11/14/2021  2:51 PM

## 2021-11-14 NOTE — Patient Instructions (Addendum)
PAP every three years Mammogram every year    Call [phone] to schedule at [facility] Colonoscopy every 10 years Labs yearly (with PCP)  Thank you for choosing [facility]. As part of our ongoing efforts to improve patient experience, we would appreciate your feedback. Please fill out the short survey that you will receive by mail or MyChart. Your opinion is important to [place]! - Dr. Kenton Kingfisher  Recommendations to boost your immunity to prevent illness such as viral flu and colds, including covid19, are as follows:       - - -  Vitamin K2 and Vitamin D3  - - - Take Vitamin K2 at 200-300 mcg daily (usually 2-3 pills daily of the over the counter formulation). Take Vitamin D3 at 3000-4000 U daily (usually 3-4 pills daily of the over the counter formulation). Studies show that these two at high normal levels in your system are very effective in keeping your immunity so strong and protective that you will be unlikely to contract viral illness such as those listed above.  Dr Wynelle Bourgeois Cohosh, Cimicifuga racemosa oral dosage forms What is this medication? BLACK COHOSH (blak  KOH hosh) or Cimicifuga racemosa is a dietary supplement. It is promoted to relieve symptoms of menopause, such as hot flashes. The FDA has not approved this supplement for any medical use. This supplement may be used for other purposes; ask your health care provider or pharmacist if you have questions. This medicine may be used for other purposes; ask your health care provider or pharmacist if you have questions. What should I tell my care team before I take this medication? They need to know if you have any of these conditions: breast cancer cervical, ovarian or uterine cancer high blood pressure infertility liver disease menstrual changes or irregular periods unusual vaginal or uterine bleeding an unusual or allergic reaction to black cohosh, soybeans, tartrazine dye (yellow dye number 5), other medicines, foods,  dyes, or preservatives pregnant or trying to get pregnant breast-feeding How should I use this medication? Take this herb by mouth with a glass of water. Follow the directions on the package labeling, or talk to your health care professional. Do not use for longer than 6 months without the advice of a health care professional. Do not use if you are pregnant or breast-feeding. Talk to your obstetrician-gynecologist or certified nurse-midwife. This herb is not for use in children under the age of 73 years. Overdosage: If you think you have taken too much of this medicine contact a poison control center or emergency room at once. NOTE: This medicine is only for you. Do not share this medicine with others. What if I miss a dose? If you miss a dose, take it as soon as you can. If it is almost time for your next dose, take only that dose. Do not take double or extra doses. What may interact with this medication? atorvastatin cisplatin fertility treatments This list may not describe all possible interactions. Give your health care provider a list of all the medicines, herbs, non-prescription drugs, or dietary supplements you use. Also tell them if you smoke, drink alcohol, or use illegal drugs. Some items may interact with your medicine. What should I watch for while using this medication? Since this herb is derived from a plant, allergic reactions are possible. Stop using this herb if you develop a rash. You may need to see your health care professional, or inform them that this occurred. Report any unusual side effects promptly.  If you are taking this herb for menstrual or menopausal symptoms, visit your doctor or health care professional for regular checks on your progress. You should have a complete check-up every 6 months. You will need a regular breast and pelvic exam while on this therapy. Follow the advice of your doctor or health care professional. Women should inform their doctor if they wish to  become pregnant or think they might be pregnant. If you have any reason to think you are pregnant, stop taking this herb at once and contact your doctor or health care professional. Herbal or dietary supplements are not regulated like medicines. Rigid quality control standards are not required for dietary supplements. The purity and strength of these products can vary. The safety and effect of this dietary supplement for a certain disease or illness is not well known. This product is not intended to diagnose, treat, cure or prevent any disease. The Food and Drug Administration suggests the following to help consumers protect themselves: Always read product labels and follow directions. Natural does not mean a product is safe for humans to take. Look for products that include USP after the ingredient name. This means that the manufacturer followed the standards of the [place] Pharmacopoeia. Supplements made or sold by a nationally known food or drug company are more likely to be made under tight controls. You can write to the company for more information about how the product was made. What side effects may I notice from receiving this medication? Side effects that you should report to your doctor or health care professional as soon as possible: allergic reactions like skin rash, itching or hives, swelling of the face, lips, or tongue breathing problems dizziness palpitations signs and symptoms of liver injury like dark yellow or brown urine; general ill feeling or flu-like symptoms; light-colored stools; loss of appetite; nausea; right upper belly pain; unusually weak or tired; yellowing of the eyes or skin unusual vaginal bleeding Side effects that usually do not require medical attention (report to your doctor or health care professional if they continue or are bothersome): breast tenderness headache nausea upset stomach This list may not describe all possible side effects. Call your doctor for  medical advice about side effects. You may report side effects to FDA at 1-800-FDA-1088. Where should I keep my medication? Keep out of the reach of children. Store at room temperature between 15 and 30 degrees C (59 and 86 degrees C). Throw away any unused herb after the expiration date. NOTE: This sheet is a summary. It may not cover all possible information. If you have questions about this medicine, talk to your doctor, pharmacist, or health care provider.  2022 Elsevier/Gold Standard (2016-05-13 00:00:00)

## 2021-11-27 ENCOUNTER — Other Ambulatory Visit: Payer: Self-pay | Admitting: Obstetrics & Gynecology

## 2021-12-01 ENCOUNTER — Other Ambulatory Visit: Payer: Self-pay

## 2021-12-01 ENCOUNTER — Ambulatory Visit
Admission: RE | Admit: 2021-12-01 | Discharge: 2021-12-01 | Disposition: A | Discharge status: Home / Self Care | Payer: 59 | Source: Ambulatory Visit | Attending: Obstetrics & Gynecology | Admitting: Obstetrics & Gynecology

## 2021-12-01 DIAGNOSIS — Z1231 Encounter for screening mammogram for malignant neoplasm of breast: Secondary | ICD-10-CM | POA: Diagnosis not present

## 2021-12-01 DIAGNOSIS — R922 Inconclusive mammogram: Secondary | ICD-10-CM | POA: Diagnosis not present

## 2022-02-10 ENCOUNTER — Other Ambulatory Visit: Payer: Self-pay | Admitting: Obstetrics & Gynecology

## 2022-02-10 DIAGNOSIS — Z1231 Encounter for screening mammogram for malignant neoplasm of breast: Secondary | ICD-10-CM

## 2022-07-22 DIAGNOSIS — E042 Nontoxic multinodular goiter: Secondary | ICD-10-CM | POA: Diagnosis not present

## 2022-07-22 DIAGNOSIS — E041 Nontoxic single thyroid nodule: Secondary | ICD-10-CM | POA: Diagnosis not present

## 2022-07-27 DIAGNOSIS — H524 Presbyopia: Secondary | ICD-10-CM | POA: Diagnosis not present

## 2022-08-11 DIAGNOSIS — M8589 Other specified disorders of bone density and structure, multiple sites: Secondary | ICD-10-CM | POA: Diagnosis not present

## 2022-08-11 DIAGNOSIS — Z0000 Encounter for general adult medical examination without abnormal findings: Secondary | ICD-10-CM | POA: Diagnosis not present

## 2022-08-11 DIAGNOSIS — E041 Nontoxic single thyroid nodule: Secondary | ICD-10-CM | POA: Diagnosis not present

## 2022-09-22 ENCOUNTER — Encounter: Payer: Self-pay | Admitting: Emergency Medicine

## 2022-09-22 ENCOUNTER — Ambulatory Visit
Admission: EM | Admit: 2022-09-22 | Discharge: 2022-09-22 | Disposition: A | Discharge status: Home / Self Care | Payer: 59 | Attending: Physician Assistant | Admitting: Physician Assistant

## 2022-09-22 DIAGNOSIS — U071 COVID-19: Secondary | ICD-10-CM | POA: Insufficient documentation

## 2022-09-22 DIAGNOSIS — B349 Viral infection, unspecified: Secondary | ICD-10-CM | POA: Insufficient documentation

## 2022-09-22 DIAGNOSIS — R0981 Nasal congestion: Secondary | ICD-10-CM | POA: Insufficient documentation

## 2022-09-22 DIAGNOSIS — R051 Acute cough: Secondary | ICD-10-CM | POA: Insufficient documentation

## 2022-09-22 LAB — RESP PANEL BY RT-PCR (FLU A&B, COVID) ARPGX2
Influenza A by PCR: NEGATIVE
Influenza B by PCR: NEGATIVE
SARS Coronavirus 2 by RT PCR: POSITIVE — AB

## 2022-09-22 MED ORDER — PROMETHAZINE-DM 6.25-15 MG/5ML PO SYRP: 5.0000 mL | ORAL_SOLUTION | Freq: Four times a day (QID) | ORAL | 0 refills | Status: DC | PRN

## 2022-09-22 MED ORDER — NIRMATRELVIR/RITONAVIR (PAXLOVID)TABLET: ORAL_TABLET | ORAL | 0 refills | Status: DC

## 2022-09-22 NOTE — ED Triage Notes (Signed)
Pt c/o cough, nasal congestion, sinus pressure. Started this morning. She has had chills but has not checked her temperature.

## 2022-09-22 NOTE — Discharge Instructions (Addendum)
-  We we will call with results of your COVID/flu test.  If testing is positive we will send antiviral medication.  If you have COVID you supposed isolate 5 days and wear mask x5 days.  In either case you should be fever free for greater than 24 hours before you are back to work. - We also discussed possibility of other flulike viral illnesses.  Plenty rest and fluids.  I sent cough medicine to pharmacy.  Follow-up as needed especially if you have any trouble fever, weakness or breathing difficulty.

## 2022-09-22 NOTE — ED Provider Notes (Signed)
[facility]    CSN: [id number] Arrival date & time: 09/22/22  1125      History   Chief Complaint Chief Complaint  Patient presents with   Nasal Congestion   Cough    HPI Priscilla Fernandez is a 51 y.o. female presenting for onset of fever, fatigue, cough, nasal congestion, chills and sinus pressure this morning.   Denies any recorded fever, but has felt feverish and temp in clinic is 100.6 degrees.  Denies any sore throat, chest pain or breathing difficulty.  No nausea/vomiting or diarrhea.  Patient denies any sick contacts.  No known exposure to COVID-19.or influenza  Patient fully vaccinated for COVID-19.  Patient has no history of cardiopulmonary disease.  Past medical history significant for hypertension.  Patient has taken over-the-counter Mucinex without improvement in symptoms.  No other concerns.  HPI  Past Medical History:  Diagnosis Date   Anemia    Breast screening, unspecified 2013   Heart murmur    Hypertension     Patient Active Problem List   Diagnosis Date Noted   History of hysterectomy 12/23/2018   Pelvic pain 09/20/2018   Iron deficiency anemia due to chronic blood loss 11/14/2015   Fibrocystic breast disease 01/18/2014   Family history of breast cancer 01/18/2014    Past Surgical History:  Procedure Laterality Date   ABDOMINAL HYSTERECTOMY     BREAST BIOPSY Right 12/16/2016   NEG; [place]   BREAST BIOPSY Right 06/02/2017   NEG   BREAST EXCISIONAL BIOPSY Bilateral 2004, 2005   multiple   BREAST SURGERY Right 12-08-02   fibrocystic   breast tumor removal   2005   COLONOSCOPY WITH PROPOFOL N/A 12/18/2019   Procedure: COLONOSCOPY WITH PROPOFOL;  Surgeon: Jonathon Bellows, MD;  Location: [facility];  Service: Endoscopy;  Laterality: N/A;   CYSTOSCOPY  10/11/2018   Procedure: CYSTOSCOPY;  Surgeon: Gae Dry, MD;  Location: [facility];  Service: Gynecology;;   DILATION AND CURETTAGE OF UTERUS     LAPAROSCOPIC HYSTERECTOMY N/A  10/11/2018   Procedure: HYSTERECTOMY TOTAL LAPAROSCOPIC bilateral salpingectomy;  Surgeon: Gae Dry, MD;  Location: [facility];  Service: Gynecology;  Laterality: N/A;    OB History     Gravida  2   Para  1   Term  1   Preterm      AB  1   Living  1      SAB  1   IAB      Ectopic      Multiple      Live Births           Obstetric Comments  First pregnancy 56 Age first period 70          Home Medications    Prior to Admission medications   Medication Sig Start Date End Date Taking? Authorizing Provider  chlorthalidone (HYGROTON) 25 MG tablet Take 25 mg by mouth daily. 10/21/21  Yes [provider]  lisinopril-hydrochlorothiazide (ZESTORETIC) 20-25 MG tablet Take 1 tablet by mouth daily. 07/03/19  Yes [provider]  nirmatrelvir/ritonavir EUA (PAXLOVID) 20 x 150 MG & 10 x '100MG'$  TABS Patient GFR is 88. Take nirmatrelvir (150 mg) two tablets twice daily for 5 days and ritonavir (100 mg) one tablet twice daily for 5 days. 09/22/22  Yes Danton Clap, PA-C  promethazine-dextromethorphan (PROMETHAZINE-DM) 6.25-15 MG/5ML syrup Take 5 mLs by mouth 4 (four) times daily as needed. 09/22/22  Yes Laurene Footman B, PA-C  benzonatate (  TESSALON) 100 MG capsule Take 1 capsule (100 mg total) by mouth every 8 (eight) hours. Patient not taking: Reported on 11/14/2021 06/19/20   Chase Picket, MD  guaifenesin (HUMIBID E) 400 MG TABS tablet Take 1 tablet (400 mg total) by mouth every 4 (four) hours. Patient not taking: Reported on 11/14/2021 06/19/20   Chase Picket, MD  ibuprofen (ADVIL) 600 MG tablet Take 1 tablet (600 mg total) by mouth every 6 (six) hours as needed. Patient not taking: Reported on 11/14/2021 06/19/20   Chase Picket, MD  ipratropium (ATROVENT) 0.06 % nasal spray Place 2 sprays into both nostrils 4 (four) times daily. Patient not taking: Reported on 11/14/2021 01/09/21   Laurene Footman B, PA-C  fluticasone [facility]) 50 MCG/ACT nasal  spray Place 1 spray into both nostrils daily. Patient not taking: Reported on 11/13/2019 05/14/18 06/19/20  Zigmund Gottron, NP    Family History Family History  Problem Relation Age of Onset   Hypertension Father    Breast cancer Maternal Grandmother 67   Cancer Paternal Grandmother        colon or rectal   Lung cancer Paternal Grandfather     Social History Social History   Tobacco Use   Smoking status: Never   Smokeless tobacco: Never  Vaping Use   Vaping Use: Never used  Substance Use Topics   Alcohol use: Yes    Alcohol/week: 1.0 standard drink of alcohol    Types: 1 Cans of beer per week    Comment: rare   Drug use: No     Allergies   Patient has no known allergies.   Review of Systems Review of Systems  Constitutional:  Positive for chills, fatigue and fever. Negative for diaphoresis.  HENT:  Positive for congestion, rhinorrhea and sinus pressure. Negative for ear pain, sinus pain and sore throat.   Respiratory:  Positive for cough and wheezing. Negative for shortness of breath.   Gastrointestinal:  Negative for abdominal pain, nausea and vomiting.  Musculoskeletal:  Negative for arthralgias and myalgias.  Skin:  Negative for rash.  Neurological:  Positive for headaches. Negative for weakness.  Hematological:  Negative for adenopathy.     Physical Exam Triage Vital Signs ED Triage Vitals  Enc Vitals Group     BP 01/09/21 1549 (!) 160/92     Pulse Rate 01/09/21 1549 61     Resp 01/09/21 1549 18     Temp 01/09/21 1549 98.7 F (37.1 C)     Temp Source 01/09/21 1549 Oral     SpO2 01/09/21 1549 100 %     Weight 01/09/21 1547 153 lb (69.4 kg)     Height 01/09/21 1547 '5\' 4"'$  (1.626 m)     Head Circumference --      Peak Flow --      Pain Score 01/09/21 1547 0     Pain Loc --      Pain Edu? --      Excl. in [place]? --    No data found.  Updated Vital Signs BP 126/85 (BP Location: Left Arm)   Pulse (!) 103   Temp (!) 100.6 F (38.1 C) (Oral)   Resp 18    Ht '5\' 3"'$  (1.6 m)   Wt 151 lb 0.2 oz (68.5 kg)   LMP 10/04/2018 (Exact Date)   SpO2 100%   BMI 26.75 kg/m       Physical Exam Vitals and nursing note reviewed.  Constitutional:  General: She is not in acute distress.    Appearance: Normal appearance. She is not ill-appearing or toxic-appearing.  HENT:     Head: Normocephalic and atraumatic.     Nose: Congestion and rhinorrhea present.     Mouth/Throat:     Mouth: Mucous membranes are moist.     Pharynx: Oropharynx is clear.  Eyes:     General: No scleral icterus.       Right eye: No discharge.        Left eye: No discharge.     Conjunctiva/sclera: Conjunctivae normal.  Cardiovascular:     Rate and Rhythm: Regular rhythm. Tachycardia present.     Heart sounds: Normal heart sounds.  Pulmonary:     Effort: Pulmonary effort is normal. No respiratory distress.     Breath sounds: Normal breath sounds. No wheezing, rhonchi or rales.  Musculoskeletal:     Cervical back: Neck supple.  Skin:    General: Skin is dry.  Neurological:     General: No focal deficit present.     Mental Status: She is alert. Mental status is at baseline.     Motor: No weakness.     Gait: Gait normal.  Psychiatric:        Mood and Affect: Mood normal.        Behavior: Behavior normal.        Thought Content: Thought content normal.      UC Treatments / Results  Labs (all labs ordered are listed, but only abnormal results are displayed) Labs Reviewed  RESP PANEL BY RT-PCR (FLU A&B, COVID) ARPGX2 - Abnormal; Notable for the following components:      Result Value   SARS Coronavirus 2 by RT PCR POSITIVE (*)    All other components within normal limits     EKG   Radiology No results found.  Procedures Procedures (including critical care time)  Medications Ordered in UC Medications - No data to display  Initial Impression / Assessment and Plan / UC Course  I have reviewed the triage vital signs and the nursing  notes.  Pertinent labs & imaging results that were available during my care of the patient were reviewed by me and considered in my medical decision making (see chart for details).   51 year old female with fever, cough, congestion, fatigue, chills this morning.  Temp is currently 100.6 degrees and she is mildly tachy at 106 bpm.  Oxygen is 100%.  She is in no acute distress and is overall well-appearing.  Exam significant for mild nasal congestion and clear rhinorrhea.  Her chest is clear to auscultation heart regular rate and rhythm.  Respiratory panel obtained today.  Positive COVID-19.  Current CDC guidelines, isolation protocol and ED precautions reviewed patient.  Sent Promethazine DM and Paxlovid to pharmacy.  Advised for her to rest and increase fluids.  Advised for her to return or go to ED if she develops a  worsening/uncontrollable fever, chest pain, breathing difficulty or any worsening symptoms. Work note provided.   Final Clinical Impressions(s) / UC Diagnoses   Final diagnoses:  Viral illness  Acute cough  Nasal congestion  COVID-19     Discharge Instructions      -We we will call with results of your COVID/flu test.  If testing is positive we will send antiviral medication.  If you have COVID you supposed isolate 5 days and wear mask x5 days.  In either case you should be fever free for greater than 24 hours  before you are back to work. - We also discussed possibility of other flulike viral illnesses.  Plenty rest and fluids.  I sent cough medicine to pharmacy.  Follow-up as needed especially if you have any trouble fever, weakness or breathing difficulty.      ED Prescriptions     Medication Sig Dispense Auth. Provider   promethazine-dextromethorphan (PROMETHAZINE-DM) 6.25-15 MG/5ML syrup Take 5 mLs by mouth 4 (four) times daily as needed. 118 mL Danton Clap, PA-C   nirmatrelvir/ritonavir EUA (PAXLOVID) 20 x 150 MG & 10 x '100MG'$  TABS Patient GFR is 88. Take  nirmatrelvir (150 mg) two tablets twice daily for 5 days and ritonavir (100 mg) one tablet twice daily for 5 days. 30 tablet Gretta Cool      PDMP not reviewed this encounter.     Danton Clap, PA-C 09/22/22 1319

## 2022-09-28 DIAGNOSIS — Z111 Encounter for screening for respiratory tuberculosis: Secondary | ICD-10-CM | POA: Diagnosis not present

## 2022-11-17 ENCOUNTER — Other Ambulatory Visit: Payer: Self-pay | Admitting: Obstetrics & Gynecology

## 2022-11-17 DIAGNOSIS — N6489 Other specified disorders of breast: Secondary | ICD-10-CM

## 2022-12-02 ENCOUNTER — Ambulatory Visit
Admission: RE | Admit: 2022-12-02 | Discharge: 2022-12-02 | Disposition: A | Discharge status: Home / Self Care | Payer: 59 | Source: Ambulatory Visit | Attending: Obstetrics & Gynecology | Admitting: Obstetrics & Gynecology

## 2022-12-02 DIAGNOSIS — N6489 Other specified disorders of breast: Secondary | ICD-10-CM

## 2023-01-07 DIAGNOSIS — L84 Corns and callosities: Secondary | ICD-10-CM | POA: Insufficient documentation

## 2023-01-26 DIAGNOSIS — M7651 Patellar tendinitis, right knee: Secondary | ICD-10-CM | POA: Insufficient documentation

## 2023-02-03 DIAGNOSIS — G479 Sleep disorder, unspecified: Secondary | ICD-10-CM | POA: Insufficient documentation

## 2023-02-03 DIAGNOSIS — R748 Abnormal levels of other serum enzymes: Secondary | ICD-10-CM | POA: Insufficient documentation

## 2023-03-29 DIAGNOSIS — R634 Abnormal weight loss: Secondary | ICD-10-CM | POA: Insufficient documentation

## 2023-04-07 DIAGNOSIS — R42 Dizziness and giddiness: Secondary | ICD-10-CM | POA: Insufficient documentation

## 2023-05-26 DIAGNOSIS — E210 Primary hyperparathyroidism: Secondary | ICD-10-CM | POA: Insufficient documentation

## 2023-11-01 ENCOUNTER — Other Ambulatory Visit: Payer: Self-pay | Admitting: Obstetrics & Gynecology

## 2023-11-01 DIAGNOSIS — Z1231 Encounter for screening mammogram for malignant neoplasm of breast: Secondary | ICD-10-CM

## 2023-12-06 ENCOUNTER — Ambulatory Visit
Admission: RE | Admit: 2023-12-06 | Discharge: 2023-12-06 | Disposition: A | Discharge status: Home / Self Care | Payer: 59 | Source: Ambulatory Visit | Attending: Obstetrics & Gynecology | Admitting: Obstetrics & Gynecology

## 2023-12-06 ENCOUNTER — Encounter: Payer: Self-pay | Admitting: Internal Medicine

## 2023-12-06 DIAGNOSIS — Z1231 Encounter for screening mammogram for malignant neoplasm of breast: Secondary | ICD-10-CM | POA: Diagnosis present

## 2024-02-25 ENCOUNTER — Encounter: Payer: Self-pay | Admitting: Internal Medicine

## 2024-02-25 ENCOUNTER — Ambulatory Visit (INDEPENDENT_AMBULATORY_CARE_PROVIDER_SITE_OTHER)

## 2024-02-25 ENCOUNTER — Ambulatory Visit: Admission: EM | Admit: 2024-02-25 | Discharge: 2024-02-25 | Disposition: A | Discharge status: Home / Self Care

## 2024-02-25 DIAGNOSIS — S8991XA Unspecified injury of right lower leg, initial encounter: Secondary | ICD-10-CM | POA: Diagnosis not present

## 2024-02-25 DIAGNOSIS — Z8679 Personal history of other diseases of the circulatory system: Secondary | ICD-10-CM | POA: Diagnosis not present

## 2024-02-25 DIAGNOSIS — S8001XA Contusion of right knee, initial encounter: Secondary | ICD-10-CM

## 2024-02-25 DIAGNOSIS — M25561 Pain in right knee: Secondary | ICD-10-CM | POA: Diagnosis not present

## 2024-02-25 MED ORDER — ACETAMINOPHEN 325 MG PO TABS
975.0000 mg | ORAL_TABLET | Freq: Once | ORAL | Status: AC
  Administered 2024-02-25: 975 mg via ORAL

## 2024-02-25 NOTE — Discharge Instructions (Addendum)
 Your xray is negative for fracture,dislocation or effusion. Rest,ice,elevate,wear ace wrap,use crutches  May take otc tylenol as label directed for pain Follow up with Occupational Health/WC-call for appt

## 2024-02-25 NOTE — ED Triage Notes (Signed)
 Pt presents with right knee pain x 1 day from a fall at work earlier today.

## 2024-02-25 NOTE — ED Provider Notes (Signed)
[facility]    CSN: [id number] Arrival date & time: 02/25/24  1453      History   Chief Complaint Chief Complaint  Patient presents with   Knee Injury   Knee Pain    HPI Priscilla Fernandez is a 53 y.o. female.   53 year old female, Priscilla Fernandez, presents to urgent care for evaluation of right knee pain after falling at work today.  Patient states she works for [employer] and was standing on a conveyor belt fell off and landed on knee on metal/concrete floor.  Patient continue to work but now has had swelling and increasing pain to right knee.   The history is provided by the patient. No language interpreter was used.    Past Medical History:  Diagnosis Date   Anemia    Breast screening, unspecified 2013   Heart murmur    Hypertension     Patient Active Problem List   Diagnosis Date Noted   Contusion of right knee 02/25/2024   Acute pain of right knee 02/25/2024   History of hypertension 02/25/2024   Primary hyperparathyroidism (HCC) 05/26/2023   Vertigo 04/07/2023   Weight loss 03/29/2023   Elevated liver enzymes 02/03/2023   Sleep disorder 02/03/2023   Patellar tendonitis of right knee 01/26/2023   Callus between toes 01/07/2023   Osteopenia of multiple sites 08/11/2022   Hypercalcemia 07/01/2021   Environmental allergies 02/21/2019   Strain of lumbar region 01/25/2019   History of hysterectomy 12/23/2018   Pelvic pain 09/20/2018   Annual physical exam 10/11/2017   Essential hypertension 10/11/2017   Iron deficiency anemia due to chronic blood loss 11/14/2015   Fibrocystic breast disease 01/18/2014   Family history of breast cancer 01/18/2014   Bronchitis 02/21/2008    Past Surgical History:  Procedure Laterality Date   ABDOMINAL HYSTERECTOMY     BREAST BIOPSY Right 12/16/2016   NEG; [place]   BREAST BIOPSY Right 06/02/2017   NEG   BREAST EXCISIONAL BIOPSY Bilateral 2004, 2005   multiple   BREAST SURGERY Right 12-08-02   fibrocystic   breast  tumor removal   2005   COLONOSCOPY WITH PROPOFOL N/A 12/18/2019   Procedure: COLONOSCOPY WITH PROPOFOL;  Surgeon: Wyline Mood, MD;  Location: [facility];  Service: Endoscopy;  Laterality: N/A;   CYSTOSCOPY  10/11/2018   Procedure: CYSTOSCOPY;  Surgeon: Nadara Mustard, MD;  Location: [facility];  Service: Gynecology;;   DILATION AND CURETTAGE OF UTERUS     LAPAROSCOPIC HYSTERECTOMY N/A 10/11/2018   Procedure: HYSTERECTOMY TOTAL LAPAROSCOPIC bilateral salpingectomy;  Surgeon: Nadara Mustard, MD;  Location: [facility];  Service: Gynecology;  Laterality: N/A;    OB History     Gravida  2   Para  1   Term  1   Preterm      AB  1   Living  1      SAB  1   IAB      Ectopic      Multiple      Live Births           Obstetric Comments  First pregnancy 21 Age first period 24          Home Medications    Prior to Admission medications   Medication Sig Start Date End Date Taking? Authorizing Provider  azithromycin (ZITHROMAX) 250 MG tablet AZITHROMYCIN 250 MG TABS 02/21/08  Yes [provider]  chlorthalidone (HYGROTON) 25 MG tablet Take 25 mg by mouth  daily. 10/21/21  Yes [provider]  HYDROcodone-acetaminophen (NORCO/VICODIN) 5-325 MG tablet Take 1 tablet by mouth every 6 (six) hours as needed. 08/31/23  Yes [provider]  lisinopril (ZESTRIL) 40 MG tablet Take by mouth. 02/24/24  Yes [provider]  lisinopril-hydrochlorothiazide (ZESTORETIC) 20-25 MG tablet Take 1 tablet by mouth daily. 07/03/19  Yes [provider]  traZODone (DESYREL) 50 MG tablet TAKE 1 TO 2 TABLETS(50 TO 100 MG) BY MOUTH EVERY NIGHT 05/10/23  Yes [provider]  benzonatate (TESSALON) 100 MG capsule Take 1 capsule (100 mg total) by mouth every 8 (eight) hours. Patient not taking: Reported on 11/14/2021 06/19/20   Merrilee Jansky, MD  guaifenesin (HUMIBID E) 400 MG TABS tablet Take 1 tablet (400 mg total) by mouth every 4 (four)  hours. Patient not taking: Reported on 11/14/2021 06/19/20   Merrilee Jansky, MD  ibuprofen (ADVIL) 600 MG tablet Take 1 tablet (600 mg total) by mouth every 6 (six) hours as needed. Patient not taking: Reported on 11/14/2021 06/19/20   Merrilee Jansky, MD  ipratropium (ATROVENT) 0.06 % nasal spray Place 2 sprays into both nostrils 4 (four) times daily. Patient not taking: Reported on 11/14/2021 01/09/21   Gareth Morgan  nirmatrelvir/ritonavir EUA (PAXLOVID) 20 x 150 MG & 10 x 100MG  TABS Patient GFR is 88. Take nirmatrelvir (150 mg) two tablets twice daily for 5 days and ritonavir (100 mg) one tablet twice daily for 5 days. 09/22/22   Shirlee Latch, PA-C  promethazine-dextromethorphan (PROMETHAZINE-DM) 6.25-15 MG/5ML syrup Take 5 mLs by mouth 4 (four) times daily as needed. 09/22/22   Eusebio Friendly B, PA-C  fluticasone (FLONASE) 50 MCG/ACT nasal spray Place 1 spray into both nostrils daily. Patient not taking: Reported on 11/13/2019 05/14/18 06/19/20  Georgetta Haber, NP    Family History Family History  Problem Relation Age of Onset   Hypertension Father    Breast cancer Maternal Grandmother 31   Cancer Paternal Grandmother        colon or rectal   Lung cancer Paternal Grandfather     Social History Social History   Tobacco Use   Smoking status: Never   Smokeless tobacco: Never  Vaping Use   Vaping status: Never Used  Substance Use Topics   Alcohol use: Yes    Alcohol/week: 1.0 standard drink of alcohol    Types: 1 Cans of beer per week    Comment: rare   Drug use: No     Allergies   Patient has no known allergies.   Review of Systems Review of Systems  Musculoskeletal:  Positive for arthralgias, joint swelling and myalgias.  Skin:  Positive for color change.  All other systems reviewed and are negative.    Physical Exam Triage Vital Signs ED Triage Vitals  Encounter Vitals Group     BP      Systolic BP Percentile      Diastolic BP Percentile      Pulse       Resp      Temp      Temp src      SpO2      Weight      Height      Head Circumference      Peak Flow      Pain Score      Pain Loc      Pain Education      Exclude from Growth Chart    No  data found.  Updated Vital Signs BP 116/76 (BP Location: Right Arm)   Pulse (!) 57   Temp 98.7 F (37.1 C) (Oral)   Resp 16   LMP 10/04/2018 (Exact Date)   SpO2 97%   Visual Acuity Right Eye Distance:   Left Eye Distance:   Bilateral Distance:    Right Eye Near:   Left Eye Near:    Bilateral Near:     Physical Exam Vitals and nursing note reviewed.  Musculoskeletal:     Right knee: Bony tenderness present. Normal pulse.       Legs:  Neurological:     General: No focal deficit present.     Mental Status: She is alert and oriented to person, place, and time.     GCS: GCS eye subscore is 4. GCS verbal subscore is 5. GCS motor subscore is 6.     Cranial Nerves: No cranial nerve deficit.     Sensory: No sensory deficit.  Psychiatric:        Attention and Perception: Attention normal.        Mood and Affect: Mood normal.        Speech: Speech normal.        Behavior: Behavior normal.      UC Treatments / Results  Labs (all labs ordered are listed, but only abnormal results are displayed) Labs Reviewed - No data to display  EKG   Radiology DG Knee Complete 4 Views Right Result Date: 02/25/2024 CLINICAL DATA:  Fall.  Right knee pain. EXAM: RIGHT KNEE - COMPLETE 4+ VIEW COMPARISON:  None Available. FINDINGS: No acute fracture or dislocation. No aggressive osseous lesion. There are degenerative changes of the knee joint in the form of mildly reduced tibio-femoral compartment joint space, tibial spiking and osteophytosis. No knee effusion or focal soft tissue swelling. No radiopaque foreign bodies. IMPRESSION: *No acute osseous abnormality of the right knee joint. Mild degenerative changes. Electronically Signed   By: Jules Schick M.D.   On: 02/25/2024 15:52     Procedures Procedures (including critical care time)  Medications Ordered in UC Medications  acetaminophen (TYLENOL) tablet 975 mg (975 mg Oral Given 02/25/24 1551)    Initial Impression / Assessment and Plan / UC Course  I have reviewed the triage vital signs and the nursing notes.  Pertinent labs & imaging results that were available during my care of the patient were reviewed by me and considered in my medical decision making (see chart for details).  Clinical Course as of 02/25/24 2003  Fri Feb 25, 2024  1523 Right knee xray ordered by nursing staff for right knee injury/pain [JD]  1546 Rest,ice,elevate,tylenol 975 mg po in office given,awaiting xray results [JD]  1600 Xray is negative for fracture,dislocation or effusion, ace wrap/crutches ordered [JD]    Clinical Course User Index [JD] Adelfa Lozito, Para March, NP    Ddx: Right knee injury, pain, sprain, fracture, joint effusion Final Clinical Impressions(s) / UC Diagnoses   Final diagnoses:  Injury of right knee, initial encounter  Contusion of right knee, initial encounter  Acute pain of right knee  History of hypertension     Discharge Instructions      Your xray is negative for fracture,dislocation or effusion. Rest,ice,elevate,wear ace wrap,use crutches  May take otc tylenol as label directed for pain Follow up with Occupational Health/WC-call for appt      ED Prescriptions   None    PDMP not reviewed this encounter.   Clancy Gourd, NP 02/25/24  2004  

## 2024-11-03 ENCOUNTER — Other Ambulatory Visit: Payer: Self-pay | Admitting: Family Medicine

## 2024-11-03 DIAGNOSIS — Z1231 Encounter for screening mammogram for malignant neoplasm of breast: Secondary | ICD-10-CM

## 2024-11-17 ENCOUNTER — Encounter: Payer: Self-pay | Admitting: Emergency Medicine

## 2024-11-17 ENCOUNTER — Ambulatory Visit
Admission: EM | Admit: 2024-11-17 | Discharge: 2024-11-17 | Disposition: A | Discharge status: Home / Self Care | Attending: Physician Assistant | Admitting: Physician Assistant

## 2024-11-17 DIAGNOSIS — R509 Fever, unspecified: Secondary | ICD-10-CM

## 2024-11-17 DIAGNOSIS — R52 Pain, unspecified: Secondary | ICD-10-CM | POA: Diagnosis not present

## 2024-11-17 DIAGNOSIS — R051 Acute cough: Secondary | ICD-10-CM

## 2024-11-17 DIAGNOSIS — J101 Influenza due to other identified influenza virus with other respiratory manifestations: Secondary | ICD-10-CM | POA: Diagnosis not present

## 2024-11-17 LAB — POCT INFLUENZA A/B
Influenza A, POC: POSITIVE — AB
Influenza B, POC: NEGATIVE

## 2024-11-17 LAB — POC SOFIA SARS ANTIGEN FIA: SARS Coronavirus 2 Ag: NEGATIVE

## 2024-11-17 MED ORDER — OSELTAMIVIR PHOSPHATE 75 MG PO CAPS: 75.0000 mg | ORAL_CAPSULE | Freq: Two times a day (BID) | ORAL | 0 refills | Status: AC

## 2024-11-17 MED ORDER — PROMETHAZINE-DM 6.25-15 MG/5ML PO SYRP: 5.0000 mL | ORAL_SOLUTION | Freq: Four times a day (QID) | ORAL | 0 refills | Status: AC | PRN

## 2024-11-17 MED ORDER — ACETAMINOPHEN 325 MG PO TABS
975.0000 mg | ORAL_TABLET | Freq: Once | ORAL | Status: AC
  Administered 2024-11-17: 975 mg via ORAL

## 2024-11-17 NOTE — ED Provider Notes (Signed)
 " [facility]    CSN: [id number] Arrival date & time: 11/17/24  9185      History   Chief Complaint Chief Complaint  Patient presents with   Cough   Generalized Body Aches    HPI Priscilla Fernandez is a 54 y.o. female presenting for onset of fever, fatigue, cough, nasal congestion, chills and body aches 2 days ago. Denies any sore throat, chest pain or breathing difficulty.  No nausea/vomiting or diarrhea.  Patient reports sick contacts at work. No known exposure to COVID-19.or influenza  Patient fully vaccinated for COVID-19.  Patient has no history of cardiopulmonary disease.  Past medical history significant for hypertension.  Patient has taken over-the-counter Mucinex  and Coricidin HBP without improvement in symptoms.  No other concerns.  HPI  Past Medical History:  Diagnosis Date   Anemia    Breast screening, unspecified 2013   Heart murmur    Hypertension     Patient Active Problem List   Diagnosis Date Noted   Contusion of right knee 02/25/2024   Acute pain of right knee 02/25/2024   History of hypertension 02/25/2024   Primary hyperparathyroidism 05/26/2023   Vertigo 04/07/2023   Weight loss 03/29/2023   Elevated liver enzymes 02/03/2023   Sleep disorder 02/03/2023   Patellar tendonitis of right knee 01/26/2023   Callus between toes 01/07/2023   Osteopenia of multiple sites 08/11/2022   Hypercalcemia 07/01/2021   Environmental allergies 02/21/2019   Strain of lumbar region 01/25/2019   History of hysterectomy 12/23/2018   Pelvic pain 09/20/2018   Annual physical exam 10/11/2017   Essential hypertension 10/11/2017   Iron deficiency anemia due to chronic blood loss 11/14/2015   Fibrocystic breast disease 01/18/2014   Family history of breast cancer 01/18/2014   Bronchitis 02/21/2008    Past Surgical History:  Procedure Laterality Date   ABDOMINAL HYSTERECTOMY     BREAST BIOPSY Right 12/16/2016   NEG; PASH   BREAST BIOPSY Right 06/02/2017    NEG   BREAST EXCISIONAL BIOPSY Bilateral 2004, 2005   multiple   BREAST SURGERY Right 12-08-02   fibrocystic   breast tumor removal   2005   COLONOSCOPY WITH PROPOFOL  N/A 12/18/2019   Procedure: COLONOSCOPY WITH PROPOFOL ;  Surgeon: Therisa Bi, MD;  Location: [facility];  Service: Endoscopy;  Laterality: N/A;   CYSTOSCOPY  10/11/2018   Procedure: CYSTOSCOPY;  Surgeon: Arloa Lamar SQUIBB, MD;  Location: [facility];  Service: Gynecology;;   DILATION AND CURETTAGE OF UTERUS     LAPAROSCOPIC HYSTERECTOMY N/A 10/11/2018   Procedure: HYSTERECTOMY TOTAL LAPAROSCOPIC bilateral salpingectomy;  Surgeon: Arloa Lamar SQUIBB, MD;  Location: [facility];  Service: Gynecology;  Laterality: N/A;    OB History     Gravida  2   Para  1   Term  1   Preterm      AB  1   Living  1      SAB  1   IAB      Ectopic      Multiple      Live Births           Obstetric Comments  First pregnancy 34 Age first period 3          Home Medications    Prior to Admission medications  Medication Sig Start Date End Date Taking? Authorizing Provider  lisinopril (ZESTRIL) 40 MG tablet Take by mouth. 02/24/24  Yes [provider]  oseltamivir (TAMIFLU) 75 MG capsule Take 1  capsule (75 mg total) by mouth every 12 (twelve) hours for 5 days. 11/17/24 11/22/24 Yes Arvis Huxley B, PA-C  promethazine -dextromethorphan (PROMETHAZINE -DM) 6.25-15 MG/5ML syrup Take 5 mLs by mouth 4 (four) times daily as needed. 11/17/24  Yes Arvis Huxley NOVAK, PA-C  chlorthalidone (HYGROTON) 25 MG tablet Take 25 mg by mouth daily. 10/21/21   [provider]  HYDROcodone-acetaminophen  (NORCO/VICODIN) 5-325 MG tablet Take 1 tablet by mouth every 6 (six) hours as needed. 08/31/23   [provider]  lisinopril-hydrochlorothiazide (ZESTORETIC) 20-25 MG tablet Take 1 tablet by mouth daily. 07/03/19   [provider]  traZODone (DESYREL) 50 MG tablet TAKE 1 TO 2 TABLETS(50 TO 100 MG) BY MOUTH EVERY NIGHT  05/10/23   [provider]  fluticasone  (FLONASE ) 50 MCG/ACT nasal spray Place 1 spray into both nostrils daily. Patient not taking: Reported on 11/13/2019 05/14/18 06/19/20  Tellis Laneta NOVAK, NP    Family History Family History  Problem Relation Age of Onset   Hypertension Father    Breast cancer Maternal Grandmother 45   Cancer Paternal Grandmother        colon or rectal   Lung cancer Paternal Grandfather     Social History Social History   Tobacco Use   Smoking status: Never   Smokeless tobacco: Never  Vaping Use   Vaping status: Never Used  Substance Use Topics   Alcohol use: Yes    Alcohol/week: 1.0 standard drink of alcohol    Types: 1 Cans of beer per week    Comment: rare   Drug use: No     Allergies   Patient has no known allergies.   Review of Systems Review of Systems  Constitutional:  Positive for chills, fatigue and fever. Negative for diaphoresis.  HENT:  Positive for congestion, rhinorrhea and voice change. Negative for ear pain, sinus pain and sore throat.   Respiratory:  Positive for cough. Negative for shortness of breath.   Cardiovascular:  Negative for chest pain.  Gastrointestinal:  Negative for abdominal pain, nausea and vomiting.  Musculoskeletal:  Positive for myalgias.  Skin:  Negative for rash.  Neurological:  Positive for headaches. Negative for weakness.  Hematological:  Negative for adenopathy.     Physical Exam Triage Vital Signs   Updated Vital Signs BP 115/77 (BP Location: Right Arm)   Pulse 78   Temp (!) 101.5 F (38.6 C) (Oral)   Resp 16   Ht 5' 3 (1.6 m)   Wt 151 lb 0.2 oz (68.5 kg)   LMP 10/04/2018   SpO2 96%   BMI 26.75 kg/m       Physical Exam Vitals and nursing note reviewed.  Constitutional:      General: She is not in acute distress.    Appearance: Normal appearance. She is ill-appearing. She is not toxic-appearing.  HENT:     Head: Normocephalic and atraumatic.     Nose: Congestion and  rhinorrhea present.     Mouth/Throat:     Mouth: Mucous membranes are moist.     Pharynx: Oropharynx is clear.  Eyes:     General: No scleral icterus.       Right eye: No discharge.        Left eye: No discharge.     Conjunctiva/sclera: Conjunctivae normal.  Cardiovascular:     Rate and Rhythm: Regular rhythm. Tachycardia present.     Heart sounds: Normal heart sounds.  Pulmonary:     Effort: Pulmonary effort is normal. No respiratory distress.  Breath sounds: Normal breath sounds. No wheezing, rhonchi or rales.  Musculoskeletal:     Cervical back: Neck supple.  Skin:    General: Skin is dry.  Neurological:     General: No focal deficit present.     Mental Status: She is alert. Mental status is at baseline.     Motor: No weakness.     Gait: Gait normal.  Psychiatric:        Mood and Affect: Mood normal.        Behavior: Behavior normal.      UC Treatments / Results  Labs (all labs ordered are listed, but only abnormal results are displayed) Labs Reviewed  POC SOFIA SARS ANTIGEN FIA  POCT INFLUENZA A/B      EKG   Radiology No results found.  Procedures Procedures (including critical care time)  Medications Ordered in UC Medications  acetaminophen  (TYLENOL ) tablet 975 mg (975 mg Oral Given 11/17/24 0834)    Initial Impression / Assessment and Plan / UC Course  I have reviewed the triage vital signs and the nursing notes.  Pertinent labs & imaging results that were available during my care of the patient were reviewed by me and considered in my medical decision making (see chart for details).   54 year old female with fever, cough, congestion, fatigue, chills 2 days ago.   Temp is currently 101.5 degrees. Oxygen is 96%.  She is in no acute distress. Ill appearing, but non toxic. Voice hoarseness.  Exam significant for mild nasal congestion and clear rhinorrhea.  Her chest is clear to auscultation heart regular rate and rhythm.  Negative COVID Positive  flu A.  Current CDC guidelines, isolation protocol and ED precautions reviewed patient.  Sent Promethazine  DM and Tamiflu to pharmacy.  Advised for her to rest and increase fluids.  Advised for her to return or go to ED if she develops a  worsening/uncontrollable fever, chest pain, breathing difficulty or any worsening symptoms. Work note provided.  Acute illness with systemic symptoms.    Final Clinical Impressions(s) / UC Diagnoses   Final diagnoses:  Body aches  Influenza A  Fever, unspecified  Acute cough     Discharge Instructions      - Flu is positive.  You are within the window for treatment Tamiflu to potentially be helpful so I sent it to the pharmacy if you are positive. - Sent cough medicine and Tamiflu. - You need to isolate until you are fever free for 24 hours and symptoms are improving. - Increase rest and fluids. - You should be seen again if you have uncontrolled fever, weakness or worsening breathing problem.        ED Prescriptions     Medication Sig Dispense Auth. Provider   promethazine -dextromethorphan (PROMETHAZINE -DM) 6.25-15 MG/5ML syrup Take 5 mLs by mouth 4 (four) times daily as needed. 118 mL Arvis Huxley B, PA-C   oseltamivir (TAMIFLU) 75 MG capsule Take 1 capsule (75 mg total) by mouth every 12 (twelve) hours for 5 days. 10 capsule Arvis Huxley NOVAK, PA-C      PDMP not reviewed this encounter.         Arvis Huxley NOVAK, PA-C 11/17/24 [phone]  "

## 2024-11-17 NOTE — ED Triage Notes (Signed)
 Pt c/o body aches, chills, cough. Started about 2 days ago.

## 2024-11-17 NOTE — Discharge Instructions (Addendum)
-   Flu is positive.  You are within the window for treatment Tamiflu to potentially be helpful so I sent it to the pharmacy if you are positive. - Sent cough medicine and Tamiflu. - You need to isolate until you are fever free for 24 hours and symptoms are improving. - Increase rest and fluids. - You should be seen again if you have uncontrolled fever, weakness or worsening breathing problem.

## 2024-12-22 ENCOUNTER — Encounter: Payer: Self-pay | Admitting: Internal Medicine

## 2024-12-29 ENCOUNTER — Encounter
